# Patient Record
Sex: Female | Born: 1995 | Hispanic: Yes | Marital: Single | Smoking: Never smoker
Health system: Southern US, Community
[De-identification: ages and names within clinical notes are randomized; demographics above are authoritative.]

## PROBLEM LIST (undated history)

## (undated) ENCOUNTER — Inpatient Hospital Stay (HOSPITAL_COMMUNITY): Payer: Self-pay

## (undated) DIAGNOSIS — F329 Major depressive disorder, single episode, unspecified: Secondary | ICD-10-CM

## (undated) DIAGNOSIS — F32A Depression, unspecified: Secondary | ICD-10-CM

## (undated) DIAGNOSIS — R002 Palpitations: Secondary | ICD-10-CM

## (undated) HISTORY — PX: TONSILLECTOMY: SUR1361

## (undated) HISTORY — PX: ADENOIDECTOMY: SUR15

## (undated) HISTORY — DX: Palpitations: R00.2

---
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
## 2016-03-10 ENCOUNTER — Emergency Department (HOSPITAL_COMMUNITY)
Admission: EM | Admit: 2016-03-10 | Discharge: 2016-03-11 | Disposition: A | Discharge status: Home / Self Care | Payer: Self-pay | Attending: Emergency Medicine | Admitting: Emergency Medicine

## 2016-03-10 ENCOUNTER — Emergency Department (HOSPITAL_COMMUNITY): Payer: Self-pay

## 2016-03-10 ENCOUNTER — Encounter (HOSPITAL_COMMUNITY): Payer: Self-pay | Admitting: Emergency Medicine

## 2016-03-10 DIAGNOSIS — R519 Headache, unspecified: Secondary | ICD-10-CM

## 2016-03-10 DIAGNOSIS — R51 Headache: Secondary | ICD-10-CM | POA: Insufficient documentation

## 2016-03-10 LAB — I-STAT CHEM 8, ED
BUN: 18 mg/dL (ref 6–20)
CHLORIDE: 105 mmol/L (ref 101–111)
CREATININE: 0.7 mg/dL (ref 0.44–1.00)
Calcium, Ion: 1.18 mmol/L (ref 1.15–1.40)
Glucose, Bld: 104 mg/dL — ABNORMAL HIGH (ref 65–99)
HCT: 36 % (ref 36.0–46.0)
Hemoglobin: 12.2 g/dL (ref 12.0–15.0)
POTASSIUM: 3.7 mmol/L (ref 3.5–5.1)
Sodium: 139 mmol/L (ref 135–145)
TCO2: 23 mmol/L (ref 0–100)

## 2016-03-10 LAB — I-STAT BETA HCG BLOOD, ED (MC, WL, AP ONLY)

## 2016-03-10 MED ORDER — KETOROLAC TROMETHAMINE 30 MG/ML IJ SOLN
30.0000 mg | Freq: Once | INTRAMUSCULAR | Status: AC
  Administered 2016-03-11: 30 mg via INTRAVENOUS
  Filled 2016-03-10: qty 1

## 2016-03-10 MED ORDER — PROCHLORPERAZINE EDISYLATE 5 MG/ML IJ SOLN: 10.0000 mg | Freq: Four times a day (QID) | INTRAMUSCULAR | Status: DC | PRN

## 2016-03-10 MED ORDER — MORPHINE SULFATE (PF) 4 MG/ML IV SOLN
4.0000 mg | Freq: Once | INTRAVENOUS | Status: AC
  Administered 2016-03-11: 4 mg via INTRAVENOUS
  Filled 2016-03-10: qty 1

## 2016-03-10 NOTE — ED Notes (Signed)
Patient transported to CT 

## 2016-03-10 NOTE — ED Provider Notes (Signed)
[facility]-EMERGENCY DEPT Provider Note   CSN: [id number] Arrival date & time: 03/10/16  2114 By signing my name below, I, Linus GalasMaharshi Patel, attest that this documentation has been prepared under the direction and in the presence of Azalia BilisKevin Bibi Economos, MD. Electronically Signed: Linus GalasMaharshi Patel, ED Scribe. 03/10/16. 11:13 PM.  History   Chief Complaint Chief Complaint  Patient presents with  . Headache   The history is provided by the patient. No language interpreter was used.   HPI Comments: Caitlin Good is a 20 y.o. female who presents to the Emergency Department with a PMHx of chronic HA complaining of a constant, worsening HA that has been ongoing for the past 2 weeks. Pt also reports associated right arm weakness and dizziness. She states that when she woke up earlier today from sleep she had a severe HA and felt like she was going to faint. She took ibuprofen, Aleve and Advil with no relief. She has been seeing a Insurance account managereurologist in [place] who has been managing her chronic HA for the past couple of years. The Neurologist has prescribed and modified different medications but has been unable to manage the pt chronic HA. She last had an MRI a few years ago. Pt denies any visual fevers, chills, CP, SOB, visual disturbance or any other symptoms at this time. NKDA  LNMP last month.   Past Medical History:  Diagnosis Date  . Chronic headaches     There are no active problems to display for this patient.  Past Surgical History:  Procedure Laterality Date  . TONSILLECTOMY     OB History    No data available     Home Medications    Prior to Admission medications   Medication Sig Start Date End Date Taking? Authorizing Provider  ibuprofen (ADVIL,MOTRIN) 600 MG tablet Take 1 tablet (600 mg total) by mouth every 6 (six) hours as needed. 07/19/13   Renne CriglerJoshua Geiple, PA-C  nortriptyline (PAMELOR) 50 MG capsule Take 50 mg by mouth at bedtime.    Historical Provider, MD  SUMAtriptan (IMITREX) 100 MG tablet  Take 100 mg by mouth every 2 (two) hours as needed for migraine or headache. May repeat in 2 hours if headache persists or recurs.    Historical Provider, MD  traMADol-acetaminophen (ULTRACET) 37.5-325 MG per tablet Take 1 tablet by mouth every 6 (six) hours as needed for moderate pain.    Historical Provider, MD    Family History History reviewed. No pertinent family history.  Social History Social History  Substance Use Topics  . Smoking status: Never Smoker  . Smokeless tobacco: Never Used  . Alcohol use No   Allergies   Review of patient's allergies indicates no known allergies.  Review of Systems Review of Systems A complete 10 system review of systems was obtained and all systems are negative except as noted in the HPI and PMH.   Physical Exam Updated Vital Signs BP 133/80   Pulse 70   Temp 98.2 F (36.8 C) (Oral)   Resp 16   Ht 5\' 2"  (1.575 m)   LMP 02/10/2016   SpO2 100%   Physical Exam  Constitutional: She is oriented to person, place, and time. She appears well-developed and well-nourished. No distress.  HENT:  Head: Normocephalic and atraumatic.  Eyes: EOM are normal. Pupils are equal, round, and reactive to light.  Neck: Normal range of motion.  Cardiovascular: Normal rate, regular rhythm and normal heart sounds.   Pulmonary/Chest: Effort normal and breath sounds normal.  Abdominal: Soft.  She exhibits no distension. There is no tenderness.  Musculoskeletal: Normal range of motion.  Neurological: She is alert and oriented to person, place, and time.  5/5 strength in major muscle groups of  bilateral upper and lower extremities. Speech normal. No facial asymetry.   Skin: Skin is warm and dry.  Psychiatric: She has a normal mood and affect. Judgment normal.  Nursing note and vitals reviewed.  ED Treatments / Results  DIAGNOSTIC STUDIES: Oxygen Saturation is 100% on room air, normal by my interpretation.    COORDINATION OF CARE: 11:13 PM Discussed  treatment plan with pt at bedside and pt agreed to plan.  Labs (all labs ordered are listed, but only abnormal results are displayed) Labs Reviewed  I-STAT CHEM 8, ED - Abnormal; Notable for the following:       Result Value   Glucose, Bld 104 (*)    All other components within normal limits  I-STAT BETA HCG BLOOD, ED ([facility], [facility], [facility] ONLY)    EKG  EKG Interpretation None      Radiology Ct Head Wo Contrast  Result Date: 03/10/2016 CLINICAL DATA:  Subacute onset of mid to posterior headache and mildly blurred vision. Mild right-sided weakness. Initial encounter. EXAM: CT HEAD WITHOUT CONTRAST TECHNIQUE: Contiguous axial images were obtained from the base of the skull through the vertex without intravenous contrast. COMPARISON:  None. FINDINGS: Brain: No evidence of acute infarction, hemorrhage, hydrocephalus, extra-axial collection or mass lesion/mass effect. The posterior fossa, including the cerebellum, brainstem and fourth ventricle, is within normal limits. The third and lateral ventricles, and basal ganglia are unremarkable in appearance. The cerebral hemispheres are symmetric in appearance, with normal gray-white differentiation. No mass effect or midline shift is seen. Vascular: No hyperdense vessel or unexpected calcification. Skull: There is no evidence of fracture; visualized osseous structures are unremarkable in appearance. Sinuses/Orbits: The visualized portions of the orbits are within normal limits. The paranasal sinuses and mastoid air cells are well-aerated. Other: No significant soft tissue abnormalities are seen. IMPRESSION: Unremarkable noncontrast CT of the head. Electronically Signed   By: Roanna RaiderJeffery  Chang M.D.   On: 03/10/2016 23:48    Procedures Procedures (including critical care time)  Medications Ordered in ED Medications  prochlorperazine (COMPAZINE) injection 10 mg (not administered)  ketorolac (TORADOL) 30 MG/ML injection 30 mg (not administered)  morphine 4 MG/ML  injection 4 mg (not administered)     Initial Impression / Assessment and Plan / ED Course  I have reviewed the triage vital signs and the nursing notes.  Pertinent labs & imaging results that were available during my care of the patient were reviewed by me and considered in my medical decision making (see chart for details).  Clinical Course   1:36 AM Feels better at this time. Dc home with outpatient neurology. Migraine variant. Head CT normal.   Final Clinical Impressions(s) / ED Diagnoses   Final diagnoses:  Acute nonintractable headache, unspecified headache type  Headache disorder    New Prescriptions New Prescriptions   No medications on file   I personally performed the services described in this documentation, which was scribed in my presence. The recorded information has been reviewed and is accurate.        Azalia BilisKevin Severino Paolo, MD 03/11/16 [phone]

## 2016-03-10 NOTE — ED Triage Notes (Signed)
Pt presents to ED for assessment of chronic headaches.  Pt sts 5 years hx of headaches, plus having been seen by a neurologist.  Pt sts many different medicines tried without relief.  Pt c/o HA with dizziness and generalized weakness.  Pt also states "there's a spot on the back of my head that if someone touches it it gives me a headache".

## 2016-05-29 ENCOUNTER — Emergency Department (HOSPITAL_COMMUNITY)
Admission: EM | Admit: 2016-05-29 | Discharge: 2016-05-29 | Disposition: A | Discharge status: Home / Self Care | Payer: Medicaid - Out of State | Attending: Emergency Medicine | Admitting: Emergency Medicine

## 2016-05-29 ENCOUNTER — Encounter (HOSPITAL_COMMUNITY): Payer: Self-pay | Admitting: Emergency Medicine

## 2016-05-29 DIAGNOSIS — R69 Illness, unspecified: Secondary | ICD-10-CM

## 2016-05-29 DIAGNOSIS — J111 Influenza due to unidentified influenza virus with other respiratory manifestations: Secondary | ICD-10-CM | POA: Insufficient documentation

## 2016-05-29 LAB — RAPID STREP SCREEN (MED CTR MEBANE ONLY): STREPTOCOCCUS, GROUP A SCREEN (DIRECT): NEGATIVE

## 2016-05-29 MED ORDER — NAPROXEN 250 MG PO TABS: 250.0000 mg | ORAL_TABLET | Freq: Two times a day (BID) | ORAL | 0 refills | Status: DC

## 2016-05-29 MED ORDER — FLUTICASONE PROPIONATE 50 MCG/ACT NA SUSP: 2.0000 | Freq: Every day | NASAL | 0 refills | Status: DC

## 2016-05-29 MED ORDER — CETIRIZINE HCL 10 MG PO TABS: 10.0000 mg | ORAL_TABLET | Freq: Every day | ORAL | 1 refills | Status: DC

## 2016-05-29 MED ORDER — BENZONATATE 100 MG PO CAPS: 100.0000 mg | ORAL_CAPSULE | Freq: Three times a day (TID) | ORAL | 0 refills | Status: DC | PRN

## 2016-05-29 MED ORDER — IBUPROFEN 800 MG PO TABS
800.0000 mg | ORAL_TABLET | Freq: Once | ORAL | Status: AC
  Administered 2016-05-29: 800 mg via ORAL
  Filled 2016-05-29: qty 1

## 2016-05-29 NOTE — ED Triage Notes (Addendum)
Pt has sore throat-- white patches noted on left side of throat-- started on Thursday--had flu shot 1 month ago, has been taking dayquil and mucinex.  this am-- had a fever at home,

## 2016-05-29 NOTE — ED Provider Notes (Signed)
[facility]-EMERGENCY DEPT Provider Note   CSN: [id number] Arrival date & time: 05/29/16  [id number]    By signing my name below, I, Caitlin Good, attest that this documentation has been prepared under the direction and in the presence of  Everlene FarrierWilliam Arnaldo Heffron, [place]. Electronically Signed: Valentino SaxonBianca Good, ED Scribe. 05/29/16. 10:56 AM.  History   Chief Complaint Chief Complaint  Patient presents with  . Sore Throat   The history is provided by the patient. No language interpreter was used.   HPI Comments: Caitlin Frohlichdriana Good is a 21 y.o. female who presents to the Emergency Department complaining of moderate, constant, sore throat onset three days ago. Pt notes hx of strep throat and states symptoms are similar. She also notes having a tonsillectomy in the past. Pt reports associated subjective fevers, cough, postnasal drip, and nasal congestion, chills and body aches. Pt's temperature in the ED today was 99.3. She reports taking DayQuil and Mucinex at 7 am today with minimal relief. She states pain with swallowing. Pt is able to swallow liquids with pain. She denies recent antibiotic use. Per nurse note, pt received a flu shot a month ago. Denies rhinorrhea, sneezing, HA, CP, SOB, abdominal pain, neck pain, rash.   Past Medical History:  Diagnosis Date  . Chronic headaches     There are no active problems to display for this patient.   Past Surgical History:  Procedure Laterality Date  . TONSILLECTOMY      OB History    No data available       Home Medications    Prior to Admission medications   Medication Sig Start Date End Date Taking? Authorizing Provider  benzonatate (TESSALON) 100 MG capsule Take 1 capsule (100 mg total) by mouth 3 (three) times daily as needed for cough. 05/29/16   Everlene FarrierWilliam Taytem Ghattas, PA-C  cetirizine (ZYRTEC ALLERGY) 10 MG tablet Take 1 tablet (10 mg total) by mouth daily. 05/29/16   Everlene FarrierWilliam Nikole Swartzentruber, PA-C  fluticasone (FLONASE) 50 MCG/ACT nasal spray Place 2 sprays into  both nostrils daily. 05/29/16   Everlene FarrierWilliam Bessie Livingood, PA-C  ibuprofen (ADVIL,MOTRIN) 600 MG tablet Take 1 tablet (600 mg total) by mouth every 6 (six) hours as needed. 07/19/13   Renne CriglerJoshua Geiple, PA-C  naproxen (NAPROSYN) 250 MG tablet Take 1 tablet (250 mg total) by mouth 2 (two) times daily with a meal. 05/29/16   Everlene FarrierWilliam Nayla Dias, PA-C  nortriptyline (PAMELOR) 50 MG capsule Take 50 mg by mouth at bedtime.    Historical Provider, MD  SUMAtriptan (IMITREX) 100 MG tablet Take 100 mg by mouth every 2 (two) hours as needed for migraine or headache. May repeat in 2 hours if headache persists or recurs.    Historical Provider, MD  traMADol-acetaminophen (ULTRACET) 37.5-325 MG per tablet Take 1 tablet by mouth every 6 (six) hours as needed for moderate pain.    Historical Provider, MD    Family History No family history on file.  Social History Social History  Substance Use Topics  . Smoking status: Never Smoker  . Smokeless tobacco: Never Used  . Alcohol use No     Allergies   Patient has no known allergies.   Review of Systems Review of Systems  Constitutional: Positive for chills, fatigue and fever.  HENT: Positive for congestion, postnasal drip, rhinorrhea, sneezing and sore throat. Negative for trouble swallowing.   Eyes: Negative for visual disturbance.  Respiratory: Positive for cough. Negative for shortness of breath.   Cardiovascular: Negative for chest pain.  Gastrointestinal: Negative for abdominal  pain.  Musculoskeletal: Positive for myalgias. Negative for neck pain and neck stiffness.  Skin: Negative for rash and wound.  Neurological: Negative for headaches.     Physical Exam Updated Vital Signs BP 128/72 (BP Location: Left Arm)   Pulse 88   Temp 99.3 F (37.4 C)   Resp 19   Ht 5\' 3"  (1.6 m)   Wt 85.3 kg   LMP 05/22/2016 (Exact Date)   SpO2 99%   BMI 33.30 kg/m   Physical Exam  Constitutional: She appears well-developed and well-nourished. No distress.  Nontoxic  appearing.  HENT:  Head: Normocephalic and atraumatic.  Right Ear: External ear normal.  Left Ear: External ear normal.  Mouth/Throat: Oropharynx is clear and moist. No oropharyngeal exudate.  TM's are normal. No erythema or loss of landmarks. Boggy nasal turbinates bilaterally. Tonsils are surgically absent. Mild posterior oropharyngeal erythema without edema. No trismus. No drooling. Uvula is midline without edema. Tongue protrusion is normal    Eyes: Conjunctivae are normal. Pupils are equal, round, and reactive to light. Right eye exhibits no discharge. Left eye exhibits no discharge.  Neck: Neck supple.  Cardiovascular: Normal rate, regular rhythm, normal heart sounds and intact distal pulses.   Pulmonary/Chest: Effort normal and breath sounds normal. No respiratory distress. She has no wheezes. She has no rales.  Lungs are clear auscultation bilaterally.  Abdominal: Soft. There is no tenderness.  Lymphadenopathy:    She has no cervical adenopathy.  Neurological: She is alert. Coordination normal.  Skin: Skin is warm and dry. Capillary refill takes less than 2 seconds. No rash noted. She is not diaphoretic. No erythema. No pallor.  Psychiatric: She has a normal mood and affect. Her behavior is normal.  Nursing note and vitals reviewed.    ED Treatments / Results   DIAGNOSTIC STUDIES: Oxygen Saturation is 99% on RA, normal by my interpretation.    COORDINATION OF CARE: 10:51 AM Discussed treatment plan with pt at bedside which includes rapid strep test and pt agreed to plan.    Labs (all labs ordered are listed, but only abnormal results are displayed) Labs Reviewed  RAPID STREP SCREEN (NOT AT [facility])  CULTURE, GROUP A STREP [facility])    EKG  EKG Interpretation None       Radiology No results found.  Procedures Procedures (including critical care time)  Medications Ordered in ED Medications  ibuprofen (ADVIL,MOTRIN) tablet 800 mg (800 mg Oral Given 05/29/16 1151)       Initial Impression / Assessment and Plan / ED Course  I have reviewed the triage vital signs and the nursing notes.  Pertinent labs & imaging results that were available during my care of the patient were reviewed by me and considered in my medical decision making (see chart for details).    This is a 21 y.o. female who presents to the Emergency Department complaining of moderate, constant, sore throat onset three days ago. Pt notes hx of strep throat and states symptoms are similar. She also notes having a tonsillectomy in the past. Pt reports associated subjective fevers, cough, postnasal drip, and nasal congestion, chills and body aches.  On exam the patient is afebrile nontoxic appearing. Tonsils are surgically absent. No exudates. Uvula is midline without edema. No peritonsillar abscess. Lungs are clear to auscultation bilaterally. Rhinorrhea present.  Rapid strep is negative. I suspect flulike illness. Patient is able to tolerate water and ibuprofen in the emergency department. We'll discharge with prescriptions for Tessalon Perles, Zyrtec, Flonase and naproxen.  I encouraged her to push oral fluids. She understands that symptom onset is greater than the 48 hour window for Tamiflu. I discussed return precautions. I advised the patient to follow-up with their primary care provider this week. I advised the patient to return to the emergency department with new or worsening symptoms or new concerns. The patient verbalized understanding and agreement with plan.      Final Clinical Impressions(s) / ED Diagnoses   Final diagnoses:  Influenza-like illness    New Prescriptions Discharge Medication List as of 05/29/2016 11:39 AM    START taking these medications   Details  benzonatate (TESSALON) 100 MG capsule Take 1 capsule (100 mg total) by mouth 3 (three) times daily as needed for cough., Starting Sun 05/29/2016, Print    cetirizine (ZYRTEC ALLERGY) 10 MG tablet Take 1 tablet (10 mg  total) by mouth daily., Starting Sun 05/29/2016, Print    fluticasone (FLONASE) 50 MCG/ACT nasal spray Place 2 sprays into both nostrils daily., Starting Sun 05/29/2016, Print    naproxen (NAPROSYN) 250 MG tablet Take 1 tablet (250 mg total) by mouth 2 (two) times daily with a meal., Starting Sun 05/29/2016, Print        I personally performed the services described in this documentation, which was scribed in my presence. The recorded information has been reviewed and is accurate.       Everlene Farrier, PA-C 05/30/16 1610    Geoffery Lyons, MD 05/30/16 [phone]

## 2016-05-31 LAB — CULTURE, GROUP A STREP (THRC)

## 2016-09-21 ENCOUNTER — Inpatient Hospital Stay (HOSPITAL_COMMUNITY)
Admission: AD | Admit: 2016-09-21 | Discharge: 2016-09-21 | Disposition: A | Discharge status: Home / Self Care | Payer: Self-pay | Source: Ambulatory Visit | Attending: Obstetrics and Gynecology | Admitting: Obstetrics and Gynecology

## 2016-09-21 ENCOUNTER — Encounter (HOSPITAL_COMMUNITY): Payer: Self-pay | Admitting: *Deleted

## 2016-09-21 ENCOUNTER — Inpatient Hospital Stay (HOSPITAL_COMMUNITY): Payer: Self-pay

## 2016-09-21 DIAGNOSIS — Z79899 Other long term (current) drug therapy: Secondary | ICD-10-CM | POA: Insufficient documentation

## 2016-09-21 DIAGNOSIS — O3680X0 Pregnancy with inconclusive fetal viability, not applicable or unspecified: Secondary | ICD-10-CM

## 2016-09-21 DIAGNOSIS — O209 Hemorrhage in early pregnancy, unspecified: Secondary | ICD-10-CM | POA: Insufficient documentation

## 2016-09-21 DIAGNOSIS — Z79891 Long term (current) use of opiate analgesic: Secondary | ICD-10-CM | POA: Insufficient documentation

## 2016-09-21 DIAGNOSIS — Z3492 Encounter for supervision of normal pregnancy, unspecified, second trimester: Secondary | ICD-10-CM

## 2016-09-21 HISTORY — DX: Major depressive disorder, single episode, unspecified: F32.9

## 2016-09-21 HISTORY — DX: Depression, unspecified: F32.A

## 2016-09-21 LAB — URINALYSIS, MICROSCOPIC (REFLEX)

## 2016-09-21 LAB — URINALYSIS, ROUTINE W REFLEX MICROSCOPIC

## 2016-09-21 LAB — CBC
HEMATOCRIT: 37.6 % (ref 36.0–46.0)
Hemoglobin: 12.7 g/dL (ref 12.0–15.0)
MCH: 27.9 pg (ref 26.0–34.0)
MCHC: 33.8 g/dL (ref 30.0–36.0)
MCV: 82.6 fL (ref 78.0–100.0)
PLATELETS: 321 10*3/uL (ref 150–400)
RBC: 4.55 MIL/uL (ref 3.87–5.11)
RDW: 14 % (ref 11.5–15.5)
WBC: 10.6 10*3/uL — AB (ref 4.0–10.5)

## 2016-09-21 LAB — WET PREP, GENITAL
CLUE CELLS WET PREP: NONE SEEN
Sperm: NONE SEEN
Trich, Wet Prep: NONE SEEN
Yeast Wet Prep HPF POC: NONE SEEN

## 2016-09-21 LAB — POCT PREGNANCY, URINE: Preg Test, Ur: POSITIVE — AB

## 2016-09-21 LAB — HCG, QUANTITATIVE, PREGNANCY: HCG, BETA CHAIN, QUANT, S: 124 m[IU]/mL — AB (ref <5)

## 2016-09-21 LAB — ABO/RH: ABO/RH(D): A POS

## 2016-09-21 NOTE — MAU Note (Addendum)
Started having abd cramps last night and saw blood and clots last night. More bleeding and some clots this am. Some abd cramping in lower abd. LMP 08/02/16. Blood is dark red to brown

## 2016-09-21 NOTE — MAU Provider Note (Signed)
Chief Complaint: No chief complaint on file.   First Provider Initiated Contact with Patient 09/21/16 1129        SUBJECTIVE  HPI: Caitlin Good is a 21 y.o. G1P0 at Unknown by LMP who presents to maternity admissions reporting heavy vaginal bleeding, blood clots and cramping that started last night.  She reports dark red to brown bleeding.  She reports 2 (+) HPT on 09/16/16. Last IC >3 wks ago. She rates that pain a 6/10.  She denies vaginal itching/burning, urinary symptoms, h/a, dizziness, n/v, or fever/chills.    Past Medical History:  Diagnosis Date  . Depression    No recent meds (used Lexapro)  . Medical history non-contributory   . Pregnancy of unknown anatomic location 09/21/2016   Past Surgical History:  Procedure Laterality Date  . ADENOIDECTOMY     Social History   Social History  . Marital status: Single    Spouse name: N/A  . Number of children: N/A  . Years of education: N/A   Occupational History  . Not on file.   Social History Main Topics  . Smoking status: Never Smoker  . Smokeless tobacco: Never Used  . Alcohol use No  . Drug use: No  . Sexual activity: Yes    Birth control/ protection: None   Other Topics Concern  . Not on file   Social History Narrative  . No narrative on file   No current facility-administered medications on file prior to encounter.    Current Outpatient Prescriptions on File Prior to Encounter  Medication Sig Dispense Refill  . benzonatate (TESSALON) 100 MG capsule Take 1 capsule (100 mg total) by mouth 3 (three) times daily as needed for cough. 21 capsule 0  . cetirizine (ZYRTEC ALLERGY) 10 MG tablet Take 1 tablet (10 mg total) by mouth daily. 30 tablet 1  . fluticasone (FLONASE) 50 MCG/ACT nasal spray Place 2 sprays into both nostrils daily. 16 g 0  . ibuprofen (ADVIL,MOTRIN) 600 MG tablet Take 1 tablet (600 mg total) by mouth every 6 (six) hours as needed. 20 tablet 0  . naproxen (NAPROSYN) 250 MG tablet Take 1 tablet  (250 mg total) by mouth 2 (two) times daily with a meal. 30 tablet 0  . nortriptyline (PAMELOR) 50 MG capsule Take 50 mg by mouth at bedtime.    . SUMAtriptan (IMITREX) 100 MG tablet Take 100 mg by mouth every 2 (two) hours as needed for migraine or headache. May repeat in 2 hours if headache persists or recurs.    . traMADol-acetaminophen (ULTRACET) 37.5-325 MG per tablet Take 1 tablet by mouth every 6 (six) hours as needed for moderate pain.     No Known Allergies  I have reviewed patient's Past Medical Hx, Surgical Hx, Family Hx, Social Hx, medications and allergies.   ROS:  Review of Systems  Constitutional: Negative.   HENT: Negative.   Eyes: Negative.   Respiratory: Negative.   Cardiovascular: Negative.   Gastrointestinal: Negative.   Endocrine: Negative.   Genitourinary: Positive for pelvic pain (UC's x 10 days) and vaginal bleeding (heavy with clots).  Musculoskeletal: Positive for back pain (lower x 10 days).  Skin: Negative.   Allergic/Immunologic: Negative.   Neurological: Negative.   Hematological: Negative.   Psychiatric/Behavioral: Negative.      Physical Exam  Physical Exam Patient Vitals for the past 24 hrs:  BP Temp Pulse Resp Height  09/21/16 1121 133/71 98.6 F (37 C) 74 18 5\' 3"  (1.6 m)   Constitutional:  Well-developed, well-nourished female in no acute distress.  Cardiovascular: normal rate Respiratory: normal effort GI: Abd soft, non-tender. Pos BS x 4 MS: Extremities nontender, no edema, normal ROM Neurologic: Alert and oriented x 4.  GU: Neg CVAT. PELVIC EXAM: Cervix pink, visually closed, without lesion, scant white creamy discharge, vaginal walls and external genitalia normal Bimanual exam: Cervix 0/long/high, firm, anterior, neg CMT, uterus nontender, nonenlarged, adnexa without tenderness, enlargement, or mass   LAB RESULTS Results for orders placed or performed during the hospital encounter of 09/21/16 (from the past 24 hour(s))   Urinalysis, Routine w reflex microscopic     Status: Abnormal   Collection Time: 09/21/16 11:10 AM  Result Value Ref Range   Color, Urine RED (A) YELLOW   APPearance TURBID (A) CLEAR   Specific Gravity, Urine  1.005 - 1.030    TEST NOT REPORTED DUE TO COLOR INTERFERENCE OF URINE PIGMENT   pH  5.0 - 8.0    TEST NOT REPORTED DUE TO COLOR INTERFERENCE OF URINE PIGMENT   Glucose, UA (A) NEGATIVE mg/dL    TEST NOT REPORTED DUE TO COLOR INTERFERENCE OF URINE PIGMENT   Hgb urine dipstick (A) NEGATIVE    TEST NOT REPORTED DUE TO COLOR INTERFERENCE OF URINE PIGMENT   Bilirubin Urine (A) NEGATIVE    TEST NOT REPORTED DUE TO COLOR INTERFERENCE OF URINE PIGMENT   Ketones, ur (A) NEGATIVE mg/dL    TEST NOT REPORTED DUE TO COLOR INTERFERENCE OF URINE PIGMENT   Protein, ur (A) NEGATIVE mg/dL    TEST NOT REPORTED DUE TO COLOR INTERFERENCE OF URINE PIGMENT   Nitrite (A) NEGATIVE    TEST NOT REPORTED DUE TO COLOR INTERFERENCE OF URINE PIGMENT   Leukocytes, UA (A) NEGATIVE    TEST NOT REPORTED DUE TO COLOR INTERFERENCE OF URINE PIGMENT  Urinalysis, Microscopic (reflex)     Status: Abnormal   Collection Time: 09/21/16 11:10 AM  Result Value Ref Range   RBC / HPF TOO NUMEROUS TO COUNT 0 - 5 RBC/hpf   WBC, UA 0-5 0 - 5 WBC/hpf   Bacteria, UA FEW (A) NONE SEEN   Squamous Epithelial / LPF 0-5 (A) NONE SEEN   Urine-Other MUCOUS PRESENT   Pregnancy, urine POC     Status: Abnormal   Collection Time: 09/21/16 11:40 AM  Result Value Ref Range   Preg Test, Ur POSITIVE (A) NEGATIVE  Wet prep, genital     Status: Abnormal   Collection Time: 09/21/16 11:55 AM  Result Value Ref Range   Yeast Wet Prep HPF POC NONE SEEN NONE SEEN   Trich, Wet Prep NONE SEEN NONE SEEN   Clue Cells Wet Prep HPF POC NONE SEEN NONE SEEN   WBC, Wet Prep HPF POC FEW (A) NONE SEEN   Sperm NONE SEEN   CBC     Status: Abnormal   Collection Time: 09/21/16 12:23 PM  Result Value Ref Range   WBC 10.6 (H) 4.0 - 10.5 K/uL   RBC 4.55  3.87 - 5.11 MIL/uL   Hemoglobin 12.7 12.0 - 15.0 g/dL   HCT 91.4 78.2 - 95.6 %   MCV 82.6 78.0 - 100.0 fL   MCH 27.9 26.0 - 34.0 pg   MCHC 33.8 30.0 - 36.0 g/dL   RDW 21.3 08.6 - 57.8 %   Platelets 321 150 - 400 K/uL  ABO/Rh     Status: None   Collection Time: 09/21/16 12:23 PM  Result Value Ref Range   ABO/RH(D) A POS  hCG, quantitative, pregnancy     Status: Abnormal   Collection Time: 09/21/16 12:23 PM  Result Value Ref Range   hCG, Beta Chain, Quant, S 124 (H) <5 mIU/mL   Results for orders placed or performed during the hospital encounter of 09/21/16 (from the past 24 hour(s))  Urinalysis, Routine w reflex microscopic     Status: Abnormal   Collection Time: 09/21/16 11:10 AM  Result Value Ref Range   Color, Urine RED (A) YELLOW   APPearance TURBID (A) CLEAR   Specific Gravity, Urine  1.005 - 1.030    TEST NOT REPORTED DUE TO COLOR INTERFERENCE OF URINE PIGMENT   pH  5.0 - 8.0    TEST NOT REPORTED DUE TO COLOR INTERFERENCE OF URINE PIGMENT   Glucose, UA (A) NEGATIVE mg/dL    TEST NOT REPORTED DUE TO COLOR INTERFERENCE OF URINE PIGMENT   Hgb urine dipstick (A) NEGATIVE    TEST NOT REPORTED DUE TO COLOR INTERFERENCE OF URINE PIGMENT   Bilirubin Urine (A) NEGATIVE    TEST NOT REPORTED DUE TO COLOR INTERFERENCE OF URINE PIGMENT   Ketones, ur (A) NEGATIVE mg/dL    TEST NOT REPORTED DUE TO COLOR INTERFERENCE OF URINE PIGMENT   Protein, ur (A) NEGATIVE mg/dL    TEST NOT REPORTED DUE TO COLOR INTERFERENCE OF URINE PIGMENT   Nitrite (A) NEGATIVE    TEST NOT REPORTED DUE TO COLOR INTERFERENCE OF URINE PIGMENT   Leukocytes, UA (A) NEGATIVE    TEST NOT REPORTED DUE TO COLOR INTERFERENCE OF URINE PIGMENT  Urinalysis, Microscopic (reflex)     Status: Abnormal   Collection Time: 09/21/16 11:10 AM  Result Value Ref Range   RBC / HPF TOO NUMEROUS TO COUNT 0 - 5 RBC/hpf   WBC, UA 0-5 0 - 5 WBC/hpf   Bacteria, UA FEW (A) NONE SEEN   Squamous Epithelial / LPF 0-5 (A) NONE SEEN    Urine-Other MUCOUS PRESENT   Pregnancy, urine POC     Status: Abnormal   Collection Time: 09/21/16 11:40 AM  Result Value Ref Range   Preg Test, Ur POSITIVE (A) NEGATIVE  Wet prep, genital     Status: Abnormal   Collection Time: 09/21/16 11:55 AM  Result Value Ref Range   Yeast Wet Prep HPF POC NONE SEEN NONE SEEN   Trich, Wet Prep NONE SEEN NONE SEEN   Clue Cells Wet Prep HPF POC NONE SEEN NONE SEEN   WBC, Wet Prep HPF POC FEW (A) NONE SEEN   Sperm NONE SEEN   CBC     Status: Abnormal   Collection Time: 09/21/16 12:23 PM  Result Value Ref Range   WBC 10.6 (H) 4.0 - 10.5 K/uL   RBC 4.55 3.87 - 5.11 MIL/uL   Hemoglobin 12.7 12.0 - 15.0 g/dL   HCT 11.9 14.7 - 82.9 %   MCV 82.6 78.0 - 100.0 fL   MCH 27.9 26.0 - 34.0 pg   MCHC 33.8 30.0 - 36.0 g/dL   RDW 56.2 13.0 - 86.5 %   Platelets 321 150 - 400 K/uL  ABO/Rh     Status: None   Collection Time: 09/21/16 12:23 PM  Result Value Ref Range   ABO/RH(D) A POS   hCG, quantitative, pregnancy     Status: Abnormal   Collection Time: 09/21/16 12:23 PM  Result Value Ref Range   hCG, Beta Chain, Quant, S 124 (H) <5 mIU/mL   US Ob Comp Less 14 Wks  Result Date: 09/21/2016  CLINICAL DATA:  Vaginal bleeding and first-trimester pregnancy. Quantitative beta HCG 124. EXAM: OBSTETRIC <14 WK US AND TRANSVAGINAL OB US TECHNIQUE: Both transabdominal and transvaginal ultrasound examinations were performed for complete evaluation of the gestation as well as the maternal uterus, adnexal regions, and pelvic cul-de-sac. Transvaginal technique was performed to assess early pregnancy. COMPARISON:  None. FINDINGS: No intra or extrauterine gestational sac is noted. Symmetric normal ovarian appearance. No adnexal mass. Small volume pelvic fluid, simple appearing. IMPRESSION: Pregnancy of unknown location. Small volume simple pelvic fluid; otherwise unremarkable pelvic ultrasound. Differential considerations include intrauterine gestation too early to be  sonographically visualized, spontaneous abortion, or ectopic pregnancy. Consider follow-up ultrasound in 10 days and serial quantitative beta HCG follow-up. Electronically Signed   By: Marnee SpringJonathon  Watts M.D.   On: 09/21/2016 13:17   [place] Ob Transvaginal  Result Date: 09/21/2016 CLINICAL DATA:  Vaginal bleeding and first-trimester pregnancy. Quantitative beta HCG 124. EXAM: OBSTETRIC <14 WK US AND TRANSVAGINAL OB US TECHNIQUE: Both transabdominal and transvaginal ultrasound examinations were performed for complete evaluation of the gestation as well as the maternal uterus, adnexal regions, and pelvic cul-de-sac. Transvaginal technique was performed to assess early pregnancy. COMPARISON:  None. FINDINGS: No intra or extrauterine gestational sac is noted. Symmetric normal ovarian appearance. No adnexal mass. Small volume pelvic fluid, simple appearing. IMPRESSION: Pregnancy of unknown location. Small volume simple pelvic fluid; otherwise unremarkable pelvic ultrasound. Differential considerations include intrauterine gestation too early to be sonographically visualized, spontaneous abortion, or ectopic pregnancy. Consider follow-up ultrasound in 10 days and serial quantitative beta HCG follow-up. Electronically Signed   By: Marnee SpringJonathon  Watts M.D.   On: 09/21/2016 13:17    MAU Management/MDM: Ordered usual first trimester r/o ectopic labs.   Pelvic exam and cultures done Will check baseline Ultrasound to rule out ectopic.  Cultures were done to rule out pelvic infection Blood drawn for Quant HCG, CBC, ABO/Rh  Return to [facility] for stat BHCG on 09/23/16 at 0800   ASSESSMENT 1. Pregnancy of unknown anatomic location   2. Vaginal bleeding in pregnancy, first trimester     PLAN Discharge home Plan to repeat HCG level in 48 hours in clinic per 8:00 am schedule on 09/23/16 Will repeat  Ultrasound in about 7-10 days if HCG levels double appropriately  Ectopic precautions  Follow-up Information    [facility] on 09/23/2016.   Specialty:  Obstetrics and Gynecology Why:  ARRIVE AT 8:00 AM Contact information: [address] [place] [id number] [phone]         Pt stable at time of discharge. Encouraged to return here or to other Urgent Care/ED if she develops worsening of symptoms, increase in pain, fever, or other concerning symptoms.    Raelyn Moraolitta Nike Southwell MSN, CNM 09/21/2016 , 1:42 PM

## 2016-09-21 NOTE — Progress Notes (Signed)
Raelyn Moraolitta Dawson CNM in to discuss test results and d/c plan. Written and verbal d/c instructions given and understanding voiced. To return Friday AM at 0800 [facility] for repeat BHCG or sooner for concerns.

## 2016-09-22 ENCOUNTER — Encounter (HOSPITAL_COMMUNITY): Payer: Self-pay

## 2016-09-22 ENCOUNTER — Inpatient Hospital Stay (HOSPITAL_COMMUNITY)
Admission: AD | Admit: 2016-09-22 | Discharge: 2016-09-22 | Disposition: A | Discharge status: Home / Self Care | Payer: Self-pay | Source: Ambulatory Visit | Attending: Obstetrics & Gynecology | Admitting: Obstetrics & Gynecology

## 2016-09-22 DIAGNOSIS — O200 Threatened abortion: Secondary | ICD-10-CM

## 2016-09-22 DIAGNOSIS — O209 Hemorrhage in early pregnancy, unspecified: Secondary | ICD-10-CM

## 2016-09-22 DIAGNOSIS — Z9889 Other specified postprocedural states: Secondary | ICD-10-CM | POA: Insufficient documentation

## 2016-09-22 DIAGNOSIS — Z3A01 Less than 8 weeks gestation of pregnancy: Secondary | ICD-10-CM | POA: Insufficient documentation

## 2016-09-22 DIAGNOSIS — O3680X0 Pregnancy with inconclusive fetal viability, not applicable or unspecified: Secondary | ICD-10-CM

## 2016-09-22 LAB — HIV ANTIBODY (ROUTINE TESTING W REFLEX): HIV Screen 4th Generation wRfx: NONREACTIVE

## 2016-09-22 LAB — CBC
HEMATOCRIT: 38.3 % (ref 36.0–46.0)
Hemoglobin: 13.1 g/dL (ref 12.0–15.0)
MCH: 28.2 pg (ref 26.0–34.0)
MCHC: 34.2 g/dL (ref 30.0–36.0)
MCV: 82.5 fL (ref 78.0–100.0)
PLATELETS: 355 10*3/uL (ref 150–400)
RBC: 4.64 MIL/uL (ref 3.87–5.11)
RDW: 13.9 % (ref 11.5–15.5)
WBC: 10.3 10*3/uL (ref 4.0–10.5)

## 2016-09-22 LAB — URINALYSIS, ROUTINE W REFLEX MICROSCOPIC
Bacteria, UA: NONE SEEN
Bilirubin Urine: NEGATIVE
Glucose, UA: NEGATIVE mg/dL
Ketones, ur: NEGATIVE mg/dL
Nitrite: NEGATIVE
PH: 6 (ref 5.0–8.0)
Protein, ur: NEGATIVE mg/dL
SPECIFIC GRAVITY, URINE: 1.024 (ref 1.005–1.030)

## 2016-09-22 LAB — GC/CHLAMYDIA PROBE AMP (~~LOC~~) NOT AT ARMC
CHLAMYDIA, DNA PROBE: NEGATIVE
NEISSERIA GONORRHEA: NEGATIVE

## 2016-09-22 LAB — HCG, QUANTITATIVE, PREGNANCY: hCG, Beta Chain, Quant, S: 76 m[IU]/mL — ABNORMAL HIGH (ref <5)

## 2016-09-22 NOTE — MAU Provider Note (Signed)
History     CSN: [id number]  Arrival date and time: 09/22/16 4098  First Provider Initiated Contact with Patient 09/22/16 1951      Chief Complaint  Patient presents with  . Vaginal Bleeding   HPI Dave Mannes is a 21 y.o. G2P0 at [redacted]w[redacted]d by LMP who presents for vaginal bleeding. Patient was seen in MAU yesterday for same complaint. Had BHCG of 124 & ultrasound that showed no IUP or adnexal mass. Patient reports vaginal bleeding has increased since yesterday & she continues to have abdominal cramping. Has gone through 3 pads today. Not saturated pads but has passed several small blood clots. Rates abdominal pain 7/10. Has not treated.   OB History    Gravida Para Term Preterm AB Living   1             SAB TAB Ectopic Multiple Live Births           0      Past Medical History:  Diagnosis Date  . Depression    No recent meds (used Lexapro)    Past Surgical History:  Procedure Laterality Date  . ADENOIDECTOMY    . TONSILLECTOMY      No family history on file.  Social History  Substance Use Topics  . Smoking status: Never Smoker  . Smokeless tobacco: Never Used  . Alcohol use No    Allergies: No Known Allergies  Prescriptions Prior to Admission  Medication Sig Dispense Refill Last Dose  . Prenatal Vit-Fe Fumarate-FA (PRENATAL MULTIVITAMIN) TABS tablet Take 1 tablet by mouth daily at 12 noon.   09/21/2016 at Unknown time    Review of Systems  Constitutional: Negative.  Negative for chills and fever.  Gastrointestinal: Positive for abdominal pain. Negative for constipation, diarrhea, nausea and vomiting.  Genitourinary: Positive for vaginal bleeding.   Physical Exam   Blood pressure (!) 134/58, pulse 73, temperature 98.7 F (37.1 C), temperature source Oral, resp. rate 18, last menstrual period 08/02/2016.  Physical Exam  Nursing note and vitals reviewed. Constitutional: She is oriented to person, place, and time. She appears well-developed and well-nourished.  No distress.  HENT:  Head: Normocephalic and atraumatic.  Eyes: Conjunctivae are normal. Right eye exhibits no discharge. Left eye exhibits no discharge. No scleral icterus.  Neck: Normal range of motion.  Respiratory: Effort normal. No respiratory distress.  GI: Soft. There is no tenderness.  Genitourinary: There is bleeding (small amount of dark red blood; dark blood slowly oozing from os; cervix closed) in the vagina.  Neurological: She is alert and oriented to person, place, and time.  Skin: Skin is warm and dry. She is not diaphoretic.  Psychiatric: She has a normal mood and affect. Her behavior is normal. Judgment and thought content normal.    MAU Course  Procedures Results for orders placed or performed during the hospital encounter of 09/22/16 (from the past 24 hour(s))  Urinalysis, Routine w reflex microscopic     Status: Abnormal   Collection Time: 09/22/16  6:40 PM  Result Value Ref Range   Color, Urine YELLOW YELLOW   APPearance CLEAR CLEAR   Specific Gravity, Urine 1.024 1.005 - 1.030   pH 6.0 5.0 - 8.0   Glucose, UA NEGATIVE NEGATIVE mg/dL   Hgb urine dipstick LARGE (A) NEGATIVE   Bilirubin Urine NEGATIVE NEGATIVE   Ketones, ur NEGATIVE NEGATIVE mg/dL   Protein, ur NEGATIVE NEGATIVE mg/dL   Nitrite NEGATIVE NEGATIVE   Leukocytes, UA SMALL (A) NEGATIVE   RBC /  HPF TOO NUMEROUS TO COUNT 0 - 5 RBC/hpf   WBC, UA TOO NUMEROUS TO COUNT 0 - 5 WBC/hpf   Bacteria, UA NONE SEEN NONE SEEN   Squamous Epithelial / LPF 0-5 (A) NONE SEEN   Mucous PRESENT   hCG, quantitative, pregnancy     Status: Abnormal   Collection Time: 09/22/16  7:14 PM  Result Value Ref Range   hCG, Beta Chain, Quant, S 76 (H) <5 mIU/mL  CBC     Status: None   Collection Time: 09/22/16  7:14 PM  Result Value Ref Range   WBC 10.3 4.0 - 10.5 K/uL   RBC 4.64 3.87 - 5.11 MIL/uL   Hemoglobin 13.1 12.0 - 15.0 g/dL   HCT 16.138.3 09.636.0 - 04.546.0 %   MCV 82.5 78.0 - 100.0 fL   MCH 28.2 26.0 - 34.0 pg   MCHC 34.2  30.0 - 36.0 g/dL   RDW 40.913.9 81.111.5 - 91.415.5 %   Platelets 355 150 - 400 K/uL    MDM A positive CBC, HCG hemoglobin stable BHCG dropped 124>76 Likely SAB but can't completely r/o ectopic since IUP never diagnosed & BHCG has not dropped more than 50% -- will get f/u bhcg in 48 hours  Assessment and Plan  A; 1. Threatened miscarriage   2. Vaginal bleeding in pregnancy, first trimester   3. Pregnancy of unknown anatomic location    P: Discharge home Pelvic rest Return to MAU Saturday evening for repeat BHCG Discussed reasons to return to MAU  Judeth HornErin Allisa Einspahr 09/22/2016, 7:51 PM

## 2016-09-22 NOTE — Discharge Instructions (Signed)
Return to care  °· If you have heavier bleeding that soaks through more that 2 pads per hour for an hour or more °· If you bleed so much that you feel like you might pass out or you do pass out °· If you have significant abdominal pain that is not improved with Tylenol  °· If you develop a fever > 100.5 ° ° ° ° ° °Pelvic Rest °Pelvic rest may be recommended if: °· Your placenta is partially or completely covering the opening of your cervix (placenta previa). °· There is bleeding between the wall of the uterus and the amniotic sac in the first trimester of pregnancy (subchorionic hemorrhage). °· You went into labor too early (preterm labor). °Based on your overall health and the health of your baby, your health care provider will decide if pelvic rest is right for you. °How do I rest my pelvis? °For as long as told by your health care provider: °· Do not have sex, sexual stimulation, or an orgasm. °· Do not use tampons. Do not douche. Do not put anything in your vagina. °· Do not lift anything that is heavier than 10 lb (4.5 kg). °· Avoid activities that take a lot of effort (are strenuous). °· Avoid any activity in which your pelvic muscles could become strained. °When should I seek medical care? °Seek medical care if you have: °· Cramping pain in your lower abdomen. °· Vaginal discharge. °· A low, dull backache. °· Regular contractions. °· Uterine tightening. °When should I seek immediate medical care? °Seek immediate medical care if: °· You have vaginal bleeding and you are pregnant. °This information is not intended to replace advice given to you by your health care provider. Make sure you discuss any questions you have with your health care provider. °Document Released: 08/20/2010 Document Revised: 10/01/2015 Document Reviewed: 10/27/2014 °Elsevier Interactive Patient Education © 2017 [employer] ° °

## 2016-09-22 NOTE — MAU Note (Signed)
Pt reports heavier vaginal bleeding and worsening cramping. Was here yesterday and told to follow up if pain and bleeding got worse.

## 2016-09-23 ENCOUNTER — Encounter (HOSPITAL_COMMUNITY): Payer: Self-pay | Admitting: Student

## 2016-09-23 ENCOUNTER — Encounter: Payer: Self-pay | Admitting: *Deleted

## 2016-09-23 ENCOUNTER — Ambulatory Visit: Payer: Self-pay

## 2016-09-23 NOTE — Progress Notes (Signed)
Pt seen in MAU last night does not need stat hcg level today. She was on the MAU Stat Sheet for today.

## 2016-09-24 ENCOUNTER — Inpatient Hospital Stay (HOSPITAL_COMMUNITY)
Admission: AD | Admit: 2016-09-24 | Discharge: 2016-09-25 | Disposition: A | Discharge status: Home / Self Care | Payer: Self-pay | Source: Ambulatory Visit | Attending: Obstetrics and Gynecology | Admitting: Obstetrics and Gynecology

## 2016-09-24 DIAGNOSIS — O4691 Antepartum hemorrhage, unspecified, first trimester: Secondary | ICD-10-CM | POA: Insufficient documentation

## 2016-09-24 DIAGNOSIS — Z3A01 Less than 8 weeks gestation of pregnancy: Secondary | ICD-10-CM | POA: Insufficient documentation

## 2016-09-24 DIAGNOSIS — O26891 Other specified pregnancy related conditions, first trimester: Secondary | ICD-10-CM | POA: Insufficient documentation

## 2016-09-24 DIAGNOSIS — R109 Unspecified abdominal pain: Secondary | ICD-10-CM | POA: Insufficient documentation

## 2016-09-24 DIAGNOSIS — O039 Complete or unspecified spontaneous abortion without complication: Secondary | ICD-10-CM

## 2016-09-24 NOTE — MAU Note (Signed)
Here for repeat blood work. Bad pains in lower abd today. Still bleeding some with some small blood clots. Used 4 pads today

## 2016-09-25 DIAGNOSIS — O039 Complete or unspecified spontaneous abortion without complication: Secondary | ICD-10-CM

## 2016-09-25 LAB — HCG, QUANTITATIVE, PREGNANCY: hCG, Beta Chain, Quant, S: 30 m[IU]/mL — ABNORMAL HIGH (ref <5)

## 2016-09-25 MED ORDER — OXYCODONE-ACETAMINOPHEN 5-325 MG PO TABS: 1.0000 | ORAL_TABLET | Freq: Four times a day (QID) | ORAL | 0 refills | Status: DC | PRN

## 2016-09-25 NOTE — Discharge Instructions (Signed)

## 2016-09-25 NOTE — MAU Provider Note (Signed)
Ms. Caitlin Good  is a 21 y.o. G1P0 at 1173w5d who presents to MAU today for follow-up quant hCG after 48 hours. She is still having some bleeding, although she states that is lighter now. She is also having intermittent abdominal pain. She has not taken anything for pain today.   BP (!) 107/49 (BP Location: Right Arm)   Pulse 72   Temp 98.1 F (36.7 C)   Resp 18   Ht 5\' 3"  (1.6 m)   Wt 200 lb (90.7 kg)   LMP 08/02/2016   BMI 35.43 kg/m   CONSTITUTIONAL: Well-developed, well-nourished female in no acute distress.  ENT: External right and left ear normal.  EYES: EOM intact, conjunctivae normal.  MUSCULOSKELETAL: Normal range of motion.  CARDIOVASCULAR: Regular heart rate RESPIRATORY: Normal effort NEUROLOGICAL: Alert and oriented to person, place, and time.  SKIN: Skin is warm and dry. No rash noted. Not diaphoretic. No erythema. No pallor. PSYCH: Normal mood and affect. Normal behavior. Normal judgment and thought content.  Results for Caitlin FrohlichSILVA, Myda (MRN [id number]) as of 09/25/2016 02:07  Ref. Range 09/21/2016 12:23 09/21/2016 13:10 09/22/2016 18:40 09/22/2016 19:14 09/24/2016 23:56  HCG, Beta Chain, Quant, S Latest Ref Range: <5 mIU/mL 124 (H)   76 (H) 30 (H)    A: SAB  P: Discharge home Patient will follow-up for labs in 1 week and [facility] Bleeding/ecoptic precautions discussed  Patient may return to MAU as needed or if her condition were to change or worsen   Marny LowensteinWenzel, Julie N, PA-C 09/25/2016 1:15 AM

## 2016-10-04 ENCOUNTER — Other Ambulatory Visit: Payer: Self-pay

## 2016-10-05 ENCOUNTER — Other Ambulatory Visit: Payer: Self-pay

## 2016-10-05 DIAGNOSIS — O039 Complete or unspecified spontaneous abortion without complication: Secondary | ICD-10-CM

## 2016-10-06 LAB — BETA HCG QUANT (REF LAB): hCG Quant: <1 m[IU]/mL

## 2016-10-06 LAB — PLEASE NOTE

## 2016-10-07 ENCOUNTER — Telehealth: Payer: Self-pay | Admitting: *Deleted

## 2016-10-07 NOTE — Telephone Encounter (Signed)
Per message from Dr. Jolayne Pantheronstant need to call patient and tell her lab shows complete resolution of pregnancy. Needs to be seen in office to discuss birth contol options Notes already has appt scheduled for follow up after sab on 10/17/16 at 1:40.    I called Caitlin Good and was unable to leave a message. I called twice , both times phone rings then cuts off- no voicmail picks up.

## 2016-10-10 NOTE — Telephone Encounter (Signed)
Caitlin Good called back 10/07/16 am and left a message she is calling for results. I called Caitlin Good and was unable to leave a message - heard message subscriber has a voicemail that has not been set up.

## 2016-10-11 NOTE — Telephone Encounter (Signed)
mychart message to patient with results and plan.

## 2016-10-17 ENCOUNTER — Encounter: Payer: Self-pay | Admitting: Advanced Practice Midwife

## 2017-02-14 ENCOUNTER — Inpatient Hospital Stay (HOSPITAL_COMMUNITY)
Admission: AD | Admit: 2017-02-14 | Discharge: 2017-02-15 | Disposition: A | Discharge status: Home / Self Care | Payer: Medicaid Other | Source: Ambulatory Visit | Attending: Family Medicine | Admitting: Family Medicine

## 2017-02-14 ENCOUNTER — Encounter (HOSPITAL_COMMUNITY): Payer: Self-pay

## 2017-02-14 DIAGNOSIS — R109 Unspecified abdominal pain: Secondary | ICD-10-CM | POA: Diagnosis present

## 2017-02-14 DIAGNOSIS — N926 Irregular menstruation, unspecified: Secondary | ICD-10-CM

## 2017-02-14 DIAGNOSIS — N912 Amenorrhea, unspecified: Secondary | ICD-10-CM | POA: Diagnosis not present

## 2017-02-14 LAB — URINALYSIS, ROUTINE W REFLEX MICROSCOPIC
Bilirubin Urine: NEGATIVE
Glucose, UA: NEGATIVE mg/dL
Hgb urine dipstick: NEGATIVE
KETONES UR: NEGATIVE mg/dL
LEUKOCYTES UA: NEGATIVE
NITRITE: NEGATIVE
PROTEIN: NEGATIVE mg/dL
Specific Gravity, Urine: 1.027 (ref 1.005–1.030)
pH: 6 (ref 5.0–8.0)

## 2017-02-14 LAB — POCT PREGNANCY, URINE: Preg Test, Ur: NEGATIVE

## 2017-02-14 NOTE — MAU Provider Note (Signed)
  History     CSN: [id number] Arrival date and time: 02/14/17 2159  First Provider Initiated Contact with Patient 02/14/17 2259      Chief Complaint  Patient presents with  . Abdominal Pain    HPI: Caitlin Good is a 21 y.o. G39P0010 female who presents to MAU d/t late menses and abdominal cramping. She reports last menses was August 1st, 2018. Did a home UPT today which was negative. Reports some mild lower abdominal pain for the past 2 days. Has not taken anything for this at home. No alleviating or aggravating factors. Denies any vaginal discharge. She is sexually active with one partner. No on birth control. Also denies fevers, chills, dysuria, hematuria, urinary frequency, nausea, vomiting, other GI or GU symptoms or other general symptoms.  Past obstetric history: OB History  Gravida Para Term Preterm AB Living  1            SAB TAB Ectopic Multiple Live Births          0    # Outcome Date GA Lbr Len/2nd Weight Sex Delivery Anes PTL Lv  1 Gravida               Past Medical History:  Diagnosis Date  . Depression    No recent meds (used Lexapro)    Past Surgical History:  Procedure Laterality Date  . ADENOIDECTOMY    . TONSILLECTOMY      History reviewed. No pertinent family history.  Social History  Substance Use Topics  . Smoking status: Never Smoker  . Smokeless tobacco: Never Used  . Alcohol use No    Allergies: No Known Allergies  Prescriptions Prior to Admission  Medication Sig Dispense Refill Last Dose  . oxyCODONE-acetaminophen (PERCOCET/ROXICET) 5-325 MG tablet Take 1 tablet by mouth every 6 (six) hours as needed for severe pain. 12 tablet 0   . Prenatal Vit-Fe Fumarate-FA (PRENATAL MULTIVITAMIN) TABS tablet Take 1 tablet by mouth daily at 12 noon.   09/21/2016 at Unknown time    Review of Systems - Negative except for what is mentioned in HPI.  Physical Exam   Blood pressure 125/67, pulse 84, temperature 98.2 F (36.8 C), temperature source Oral,  resp. rate 18, height  (1.6 m), weight 201 lb (91.2 kg), last menstrual period 12/07/2016, SpO2 100 %, unknown if currently breastfeeding.  Constitutional: no acute distress.  HENT: V[place]/AT Eyes: normal conjunctivae, no scleral icterus Cardiovascular: normal rate Respiratory: normal WOB GI: Abd soft, non-tender, gravid appropriate for gestational age.   GU: Neg CVAT. Pelvic: NEFG, scant vaginal discharge, no blood, cervix clean. No CMT, uterus not enlarged or tender. Adnexa not palpable d/t body habitus. MSK: no edema, normal ROM Neurologic: Alert and oriented x 4. Psych: Normal mood and affect Skin: warm and dry    MAU Course  Procedures  MDM Exam as above. VS wnl. UPT negative. Wet prep, GC/Chlamydia sent  Discussed ddx of late menses with patient. Discussed medical management and trial of Provera x 10 days for withdrawal bleed. Pt decided for expectant management given onset of mild cramping today, will await menses. Advised follow up with PCP or OBGYN if menses does not come, or if persistently if persistently going more than 2 months w/o menses.  Assessment and Plan  Assessment: 1. Missed menses     Plan: --Discharge home in stable condition.  --Follow up recommended as above   Bama Hanselman, Kandra Nicolas, MD 02/14/2017 11:00 PM

## 2017-02-14 NOTE — MAU Note (Signed)
Pt states she has not had a period since August 1st, 2018. States she had a -upt at home today. States she has sharp, lower abdominal pain, more on right side, that started 2 days ago. Pt denies vaginal bleeding or vaginal discharge.

## 2017-02-15 DIAGNOSIS — N926 Irregular menstruation, unspecified: Secondary | ICD-10-CM

## 2017-02-15 LAB — WET PREP, GENITAL
CLUE CELLS WET PREP: NONE SEEN
SPERM: NONE SEEN
TRICH WET PREP: NONE SEEN
YEAST WET PREP: NONE SEEN

## 2017-02-15 LAB — GC/CHLAMYDIA PROBE AMP (~~LOC~~) NOT AT ARMC
Chlamydia: NEGATIVE
Neisseria Gonorrhea: NEGATIVE

## 2017-02-23 ENCOUNTER — Inpatient Hospital Stay (HOSPITAL_COMMUNITY): Payer: Medicaid Other

## 2017-02-23 ENCOUNTER — Inpatient Hospital Stay (HOSPITAL_COMMUNITY)
Admission: AD | Admit: 2017-02-23 | Discharge: 2017-02-23 | Disposition: A | Discharge status: Home / Self Care | Payer: Medicaid Other | Source: Ambulatory Visit | Attending: Obstetrics & Gynecology | Admitting: Obstetrics & Gynecology

## 2017-02-23 ENCOUNTER — Encounter (HOSPITAL_COMMUNITY): Payer: Self-pay | Admitting: *Deleted

## 2017-02-23 DIAGNOSIS — O26899 Other specified pregnancy related conditions, unspecified trimester: Secondary | ICD-10-CM

## 2017-02-23 DIAGNOSIS — Z3A01 Less than 8 weeks gestation of pregnancy: Secondary | ICD-10-CM | POA: Diagnosis not present

## 2017-02-23 DIAGNOSIS — O3680X0 Pregnancy with inconclusive fetal viability, not applicable or unspecified: Secondary | ICD-10-CM

## 2017-02-23 DIAGNOSIS — O26891 Other specified pregnancy related conditions, first trimester: Secondary | ICD-10-CM | POA: Diagnosis not present

## 2017-02-23 DIAGNOSIS — R102 Pelvic and perineal pain: Secondary | ICD-10-CM | POA: Insufficient documentation

## 2017-02-23 DIAGNOSIS — R1030 Lower abdominal pain, unspecified: Secondary | ICD-10-CM | POA: Diagnosis present

## 2017-02-23 LAB — URINALYSIS, ROUTINE W REFLEX MICROSCOPIC
BILIRUBIN URINE: NEGATIVE
GLUCOSE, UA: NEGATIVE mg/dL
Hgb urine dipstick: NEGATIVE
KETONES UR: NEGATIVE mg/dL
LEUKOCYTES UA: NEGATIVE
NITRITE: NEGATIVE
PH: 5 (ref 5.0–8.0)
Protein, ur: NEGATIVE mg/dL
Specific Gravity, Urine: 1.027 (ref 1.005–1.030)

## 2017-02-23 LAB — CBC
HCT: 35 % — ABNORMAL LOW (ref 36.0–46.0)
Hemoglobin: 12.2 g/dL (ref 12.0–15.0)
MCH: 28.6 pg (ref 26.0–34.0)
MCHC: 34.9 g/dL (ref 30.0–36.0)
MCV: 82 fL (ref 78.0–100.0)
Platelets: 334 10*3/uL (ref 150–400)
RBC: 4.27 MIL/uL (ref 3.87–5.11)
RDW: 13.8 % (ref 11.5–15.5)
WBC: 12.1 10*3/uL — ABNORMAL HIGH (ref 4.0–10.5)

## 2017-02-23 LAB — POCT PREGNANCY, URINE: Preg Test, Ur: POSITIVE — AB

## 2017-02-23 LAB — HCG, QUANTITATIVE, PREGNANCY: hCG, Beta Chain, Quant, S: 889 m[IU]/mL — ABNORMAL HIGH (ref <5)

## 2017-02-23 NOTE — MAU Note (Addendum)
PT SAYS ABD PAIN STARTED   MON AND WORSE  AT 2 PM TODAY.     SHE CALLED [facility] -  YESTERDAY.     SHE CALLED [facility]- FOR AN APPOINTMENT -  TOLD  HER  NEEDS MONEY.   SO WENT   TO CLINIC  TODAY - TOLD  HER COULD   NOT SEE HER UNTIL DEC.        LAST SEX-  2 WEEKS  AGO.   NO MEDS  FOR PAIN

## 2017-02-23 NOTE — MAU Provider Note (Signed)
Chief Complaint: Abdominal Pain   First Provider Initiated Contact with Patient 02/23/17 2123        SUBJECTIVE HPI: Caitlin Good is a 21 y.o. G1P0010 at Unknown by LMP who presents to maternity admissions reporting pain in lower abdomen since Monday.  Got worse today.  Was seen 10 days ago and UPT was negative. It is positive today. . She denies vaginal bleeding, vaginal itching/burning, urinary symptoms, h/a, dizziness, n/v, or fever/chills.    Abdominal Pain  This is a recurrent problem. The current episode started in the past 7 days. The onset quality is gradual. The problem occurs intermittently. The problem has been unchanged. The pain is located in the LLQ, RLQ and suprapubic region. The quality of the pain is cramping. The abdominal pain does not radiate. Pertinent negatives include no constipation, diarrhea, dysuria, fever, headaches, myalgias, nausea or vomiting. Nothing aggravates the pain. The pain is relieved by nothing. She has tried nothing for the symptoms.    RN Note: PT SAYS ABD PAIN STARTED   MON AND WORSE  AT 2 PM TODAY.     SHE CALLED [facility] -  YESTERDAY.     SHE CALLED [facility]- FOR AN APPOINTMENT -  TOLD  HER  NEEDS MONEY.   SO WENT   TO CLINIC  TODAY - TOLD  HER COULD   NOT SEE HER UNTIL DEC.        LAST SEX-  2 WEEKS  AGO.   NO MEDS  FOR PAIN   Past Medical History:  Diagnosis Date  . Depression    No recent meds (used Lexapro)   Past Surgical History:  Procedure Laterality Date  . ADENOIDECTOMY    . TONSILLECTOMY     Social History   Social History  . Marital status: Single    Spouse name: N/A  . Number of children: N/A  . Years of education: N/A   Occupational History  . Not on file.   Social History Main Topics  . Smoking status: Never Smoker  . Smokeless tobacco: Never Used  . Alcohol use No  . Drug use: No  . Sexual activity: Yes    Birth control/ protection: None   Other Topics Concern  . Not on file   Social History  Narrative  . No narrative on file   No current facility-administered medications on file prior to encounter.    Current Outpatient Prescriptions on File Prior to Encounter  Medication Sig Dispense Refill  . Prenatal Vit-Fe Fumarate-FA (PRENATAL MULTIVITAMIN) TABS tablet Take 1 tablet by mouth daily at 12 noon.    Marland Kitchen oxyCODONE-acetaminophen (PERCOCET/ROXICET) 5-325 MG tablet Take 1 tablet by mouth every 6 (six) hours as needed for severe pain. 12 tablet 0   No Known Allergies  I have reviewed patient's Past Medical Hx, Surgical Hx, Family Hx, Social Hx, medications and allergies.   ROS:  Review of Systems  Constitutional: Negative for fever.  Gastrointestinal: Positive for abdominal pain. Negative for constipation, diarrhea, nausea and vomiting.  Genitourinary: Negative for dysuria.  Musculoskeletal: Negative for myalgias.  Neurological: Negative for headaches.   Review of Systems  Other systems negative   Physical Exam  Physical Exam Patient Vitals for the past 24 hrs:  BP Temp Temp src Pulse Resp Height Weight  02/23/17 2115 (!) 118/56 98.2 F (36.8 C) Oral 82 20 5\' 3"  (1.6 m) 202 lb 12 oz (92 kg)   Constitutional: Well-developed, well-nourished female in no acute distress.  Cardiovascular: normal rate  Respiratory: normal effort GI: Abd soft, non-tender. Pos BS x 4 MS: Extremities nontender, no edema, normal ROM Neurologic: Alert and oriented x 4.  GU: Neg CVAT.  PELVIC EXAM: Cervix pink, visually closed, without lesion, scant white creamy discharge, vaginal walls and external genitalia normal Bimanual exam: Cervix 0/long/high, firm, anterior, neg CMT, uterus nontender, nonenlarged, adnexa without tenderness, enlargement, or mass   LAB RESULTS Results for orders placed or performed during the hospital encounter of 02/23/17 (from the past 24 hour(s))  Urinalysis, Routine w reflex microscopic (not at [facility])     Status: None   Collection Time: 02/23/17  9:17 PM  Result  Value Ref Range   Color, Urine YELLOW YELLOW   APPearance CLEAR CLEAR   Specific Gravity, Urine 1.027 1.005 - 1.030   pH 5.0 5.0 - 8.0   Glucose, UA NEGATIVE NEGATIVE mg/dL   Hgb urine dipstick NEGATIVE NEGATIVE   Bilirubin Urine NEGATIVE NEGATIVE   Ketones, ur NEGATIVE NEGATIVE mg/dL   Protein, ur NEGATIVE NEGATIVE mg/dL   Nitrite NEGATIVE NEGATIVE   Leukocytes, UA NEGATIVE NEGATIVE  Pregnancy, urine POC     Status: Abnormal   Collection Time: 02/23/17  9:22 PM  Result Value Ref Range   Preg Test, Ur POSITIVE (A) NEGATIVE  CBC     Status: Abnormal   Collection Time: 02/23/17  9:38 PM  Result Value Ref Range   WBC 12.1 (H) 4.0 - 10.5 K/uL   RBC 4.27 3.87 - 5.11 MIL/uL   Hemoglobin 12.2 12.0 - 15.0 g/dL   HCT 16.1 (L) 09.6 - 04.5 %   MCV 82.0 78.0 - 100.0 fL   MCH 28.6 26.0 - 34.0 pg   MCHC 34.9 30.0 - 36.0 g/dL   RDW 40.9 81.1 - 91.4 %   Platelets 334 150 - 400 K/uL  hCG, quantitative, pregnancy     Status: Abnormal   Collection Time: 02/23/17  9:38 PM  Result Value Ref Range   hCG, Beta Chain, Quant, S 889 (H) <5 mIU/mL    --/--/A POS (05/16 1223)  IMAGING US Ob Comp Less 14 Wks  Result Date: 02/23/2017 CLINICAL DATA:  21 y/o F; abdominal pain. Pregnancy test in process. EXAM: OBSTETRIC <14 WK [place] AND TRANSVAGINAL OB US TECHNIQUE: Both transabdominal and transvaginal ultrasound examinations were performed for complete evaluation of the gestation as well as the maternal uterus, adnexal regions, and pelvic cul-de-sac. Transvaginal technique was performed to assess early pregnancy. COMPARISON:  09/21/2016 ultrasound of the pelvis. FINDINGS: Intrauterine gestational sac: Single Yolk sac:  Not Visualized. Embryo:  Not Visualized. Cardiac Activity: Not Visualized. MSD: 2  mm   4 w   6  d Subchorionic hemorrhage:  None visualized. Maternal uterus/adnexae: Right ovary measures 4 x 3.0 x 2.7 cm and there is a small cyst, probably corpus luteum. The left ovary measures 2.8 x 1.3 x 1.8  cm IMPRESSION: Probable early intrauterine gestational sac, but no yolk sac, fetal pole, or cardiac activity yet visualized. Recommend follow-up quantitative B-HCG levels and follow-up US in 14 days to assess viability. This recommendation follows SRU consensus guidelines: Diagnostic Criteria for Nonviable Pregnancy Early in the First Trimester. Malva Limes Med 2013; 782:9562-13. Electronically Signed   By: Mitzi Hansen M.D.   On: 02/23/2017 22:14   US Ob Transvaginal  Result Date: 02/23/2017 CLINICAL DATA:  21 y/o F; abdominal pain. Pregnancy test in process. EXAM: OBSTETRIC <14 WK [place] AND TRANSVAGINAL OB US TECHNIQUE: Both transabdominal and transvaginal ultrasound examinations were performed  for complete evaluation of the gestation as well as the maternal uterus, adnexal regions, and pelvic cul-de-sac. Transvaginal technique was performed to assess early pregnancy. COMPARISON:  09/21/2016 ultrasound of the pelvis. FINDINGS: Intrauterine gestational sac: Single Yolk sac:  Not Visualized. Embryo:  Not Visualized. Cardiac Activity: Not Visualized. MSD: 2  mm   4 w   6  d Subchorionic hemorrhage:  None visualized. Maternal uterus/adnexae: Right ovary measures 4 x 3.0 x 2.7 cm and there is a small cyst, probably corpus luteum. The left ovary measures 2.8 x 1.3 x 1.8 cm IMPRESSION: Probable early intrauterine gestational sac, but no yolk sac, fetal pole, or cardiac activity yet visualized. Recommend follow-up quantitative B-HCG levels and follow-up US in 14 days to assess viability. This recommendation follows SRU consensus guidelines: Diagnostic Criteria for Nonviable Pregnancy Early in the First Trimester. Malva Limes Engl J Med 2013; 045:4098-11; 369:1443-51. Electronically Signed   By: Mitzi HansenLance  Furusawa-Stratton M.D.   On: 02/23/2017 22:14    MAU Management/MDM: Ordered usual first trimester r/o ectopic labs.   Pelvic exam and cultures done on last visit Will check baseline Ultrasound to rule out ectopic.  This  bleeding/pain can represent a normal pregnancy with bleeding, spontaneous abortion or even an ectopic which can be life-threatening.  The process as listed above helps to determine which of these is present.  Ultrasound shows probable IUGS, no yolk sac, no evidence of ectopic pregnancy. BHCG 800. Likely early IUP but can't r/o ectopic at this time. Will have patient return for f/u BHCG  ASSESSMENT 1. Pregnancy of unknown anatomic location   2. Pelvic cramping in antepartum period      PLAN Care turned over to oncoming provider.  Wynelle BourgeoisMarie Williams CNM, MSN Certified Nurse-Midwife 02/23/2017  9:23 PM   P: Discharge home Discussed reasons to return to MAU for worsening pain or heavy bleeding Report to MAU Sunday morning for repeat BHCG  Judeth HornLawrence, Marquelle Musgrave, NP

## 2017-02-23 NOTE — Discharge Instructions (Signed)
Return to care  °· If you have heavier bleeding that soaks through more that 2 pads per hour for an hour or more °· If you bleed so much that you feel like you might pass out or you do pass out °· If you have significant abdominal pain that is not improved with Tylenol  °· If you develop a fever > 100.5 ° ° °Abdominal Pain During Pregnancy °Abdominal pain is common in pregnancy. Most of the time, it does not cause harm. There are many causes of abdominal pain. Some causes are more serious than others and sometimes the cause is not known. Abdominal pain can be a sign that something is very wrong with the pregnancy or the pain may have nothing to do with the pregnancy. Always tell your health care provider if you have any abdominal pain. °Follow these instructions at home: °· Do not have sex or put anything in your vagina until your symptoms go away completely. °· Watch your abdominal pain for any changes. °· Get plenty of rest until your pain improves. °· Drink enough fluid to keep your urine clear or pale yellow. °· Take over-the-counter or prescription medicines only as told by your health care provider. °· Keep all follow-up visits as told by your health care provider. This is important. °Contact a health care provider if: °· You have a fever. °· Your pain gets worse or you have cramping. °· Your pain continues after resting. °Get help right away if: °· You are bleeding, leaking fluid, or passing tissue from the vagina. °· You have vomiting or diarrhea that does not go away. °· You have painful or bloody urination. °· You notice a decrease in your baby's movements. °· You feel very weak or faint. °· You have shortness of breath. °· You develop a severe headache with abdominal pain. °· You have abnormal vaginal discharge with abdominal pain. °This information is not intended to replace advice given to you by your health care provider. Make sure you discuss any questions you have with your health care  provider. °Document Released: 04/25/2005 Document Revised: 02/04/2016 Document Reviewed: 11/22/2012 °Elsevier Interactive Patient Education © 2018 Elsevier Inc. ° °

## 2017-02-26 ENCOUNTER — Inpatient Hospital Stay (HOSPITAL_COMMUNITY)
Admission: AD | Admit: 2017-02-26 | Discharge: 2017-02-26 | Disposition: A | Discharge status: Home / Self Care | Payer: Medicaid Other | Source: Ambulatory Visit | Attending: Obstetrics & Gynecology | Admitting: Obstetrics & Gynecology

## 2017-02-26 DIAGNOSIS — O26891 Other specified pregnancy related conditions, first trimester: Secondary | ICD-10-CM | POA: Diagnosis not present

## 2017-02-26 DIAGNOSIS — Z3A10 10 weeks gestation of pregnancy: Secondary | ICD-10-CM | POA: Diagnosis not present

## 2017-02-26 DIAGNOSIS — R102 Pelvic and perineal pain: Secondary | ICD-10-CM | POA: Insufficient documentation

## 2017-02-26 DIAGNOSIS — O3680X0 Pregnancy with inconclusive fetal viability, not applicable or unspecified: Secondary | ICD-10-CM

## 2017-02-26 LAB — HCG, QUANTITATIVE, PREGNANCY: HCG, BETA CHAIN, QUANT, S: 3347 m[IU]/mL — AB (ref <5)

## 2017-02-26 NOTE — MAU Provider Note (Signed)
  History     CSN: [id number]  Arrival date and time: 02/26/17 1413   None     Chief Complaint  Patient presents with  . Follow-up   Caitlin Good is a 21 y.o. G2P0010 at 4565w3d who presents today for FU HCG. She denies any pain or bleeding today. She was seen for +UPT and pain on 02/23/17. No IUP seen on US. Instructed to return for repeat HCG today.    Pelvic Pain  The patient's primary symptoms include pelvic pain. The patient's pertinent negatives include no vaginal discharge. This is a new problem. The current episode started in the past 7 days. The problem occurs intermittently. The problem has been resolved. The patient is experiencing no pain. She is pregnant. Pertinent negatives include no chills, fever, nausea or vomiting. The vaginal discharge was normal. There has been no bleeding. Nothing aggravates the symptoms. She has tried nothing for the symptoms.    Past Medical History:  Diagnosis Date  . Depression    No recent meds (used Lexapro)    Past Surgical History:  Procedure Laterality Date  . ADENOIDECTOMY    . TONSILLECTOMY      No family history on file.  Social History  Substance Use Topics  . Smoking status: Never Smoker  . Smokeless tobacco: Never Used  . Alcohol use No    Allergies: No Known Allergies  Prescriptions Prior to Admission  Medication Sig Dispense Refill Last Dose  . Prenatal Vit-Fe Fumarate-FA (PRENATAL MULTIVITAMIN) TABS tablet Take 1 tablet by mouth daily at 12 noon.   02/22/2017 at Unknown time    Review of Systems  Constitutional: Negative for chills and fever.  Gastrointestinal: Negative for nausea and vomiting.  Genitourinary: Positive for pelvic pain. Negative for vaginal bleeding and vaginal discharge.   Physical Exam   Blood pressure (!) 114/59, pulse 78, temperature 98.3 F (36.8 C), temperature source Oral, resp. rate 18, last menstrual period 12/15/2016, SpO2 100 %, unknown if currently breastfeeding.  Physical Exam   Nursing note and vitals reviewed. Constitutional: She is oriented to person, place, and time. She appears well-developed and well-nourished. No distress.  HENT:  Head: Normocephalic.  Cardiovascular: Normal rate.   Respiratory: Effort normal.  GI: Soft. There is no tenderness. There is no rebound.  Neurological: She is alert and oriented to person, place, and time.  Skin: Skin is warm and dry.  Psychiatric: She has a normal mood and affect.     Results for Caitlin FrohlichSILVA, Velicia (MRN [id number]) as of 02/26/2017 15:31  Ref. Range 02/23/2017 21:38 02/23/2017 22:09 02/26/2017 14:18  HCG, Beta Chain, Quant, S Latest Ref Range: <5 mIU/mL 889 (H)  3,347 (H)   MAU Course  Procedures  MDM   Assessment and Plan   1. Pregnancy, location unknown   2. Pregnancy of unknown anatomic location    DC home HCG has more than doubled in 48 hours, will get repeat US this week  1st Trimester precautions  Ectopic precautions RX: none, outpatient US ordered  Return to MAU as needed  Follow-up Information    [facility] Follow up.   Specialty:  Radiology Why:  They will call you with an appointment  Contact information: [address] [id number] [place] [id number] [phone]           Thressa ShellerHeather Hogan 02/26/2017, 3:32 PM

## 2017-02-26 NOTE — MAU Note (Signed)
Presents for repeat labs.  No complaints

## 2017-02-26 NOTE — Discharge Instructions (Signed)
Ectopic Pregnancy An ectopic pregnancy is when the fertilized egg attaches (implants) outside the uterus. Most ectopic pregnancies occur in one of the tubes where eggs travel from the ovary to the uterus (fallopian tubes), but the implanting can occur in other locations. In rare cases, ectopic pregnancies occur on the ovary, intestine, pelvis, abdomen, or cervix. In an ectopic pregnancy, the fertilized egg does not have the ability to develop into a normal, healthy baby. A ruptured ectopic pregnancy is one in which tearing or bursting of a fallopian tube causes internal bleeding. Often, there is intense lower abdominal pain, and vaginal bleeding sometimes occurs. Having an ectopic pregnancy can be life-threatening. If this dangerous condition is not treated, it can lead to blood loss, shock, or even death. What are the causes? The most common cause of this condition is damage to one of the fallopian tubes. A fallopian tube may be narrowed or blocked, and that keeps the fertilized egg from reaching the uterus. What increases the risk? This condition is more likely to develop in women of childbearing age who have different levels of risk. The levels of risk can be divided into three categories. High risk  You have gone through infertility treatment.  You have had an ectopic pregnancy before.  You have had surgery on the fallopian tubes, or another surgical procedure, such as an abortion.  You have had surgery to have the fallopian tubes tied (tubal ligation).  You have problems or diseases of the fallopian tubes.  You have been exposed to diethylstilbestrol (DES). This medicine was used until 1971, and it had effects on babies whose mothers took the medicine.  You become pregnant while using an IUD (intrauterine device) for birth control. Moderate risk  You have a history of infertility.  You have had an STI (sexually transmitted infection).  You have a history of pelvic inflammatory  disease (PID).  You have scarring from endometriosis.  You have multiple sexual partners.  You smoke. Low risk  You have had pelvic surgery.  You use vaginal douches.  You became sexually active before age 18. What are the signs or symptoms? Common symptoms of this condition include normal pregnancy symptoms, such as missing a period, nausea, tiredness, abdominal pain, breast tenderness, and bleeding. However, ectopic pregnancy will have additional symptoms, such as:  Pain with intercourse.  Irregular vaginal bleeding or spotting.  Cramping or pain on one side or in the lower abdomen.  Fast heartbeat, low blood pressure, and sweating.  Passing out while having a bowel movement.  Symptoms of a ruptured ectopic pregnancy and internal bleeding may include:  Sudden, severe pain in the abdomen and pelvis.  Dizziness, weakness, light-headedness, or fainting.  Pain in the shoulder or neck area.  How is this diagnosed? This condition is diagnosed by:  A pelvic exam to locate pain or a mass in the abdomen.  A pregnancy test. This blood test checks for the presence as well as the specific level of pregnancy hormone in the bloodstream.  Ultrasound. This is performed if a pregnancy test is positive. In this test, a probe is inserted into the vagina. The probe will detect a fetus, possibly in a location other than the uterus.  Taking a sample of uterus tissue (dilation and curettage, or D&C).  Surgery to perform a visual exam of the inside of the abdomen using a thin, lighted tube that has a tiny camera on the end (laparoscope).  Culdocentesis. This procedure involves inserting a needle at the top   of the vagina, behind the uterus. If blood is present in this area, it may indicate that a fallopian tube is torn.  How is this treated? This condition is treated with medicine or surgery. Medicine  An injection of a medicine (methotrexate) may be given to cause the pregnancy tissue  to be absorbed. This medicine may save your fallopian tube. It may be given if: ? The diagnosis is made early, with no signs of active bleeding. ? The fallopian tube has not ruptured. ? You are considered to be a good candidate for the medicine. Usually, pregnancy hormone blood levels are checked after methotrexate treatment. This is to be sure that the medicine is effective. It may take 4-6 weeks for the pregnancy to be absorbed. Most pregnancies will be absorbed by 3 weeks. Surgery  A laparoscope may be used to remove the pregnancy tissue.  If severe internal bleeding occurs, a larger cut (incision) may be made in the lower abdomen (laparotomy) to remove the fetus and placenta. This is done to stop the bleeding.  Part or all of the fallopian tube may be removed (salpingectomy) along with the fetus and placenta. The fallopian tube may also be repaired during the surgery.  In very rare circumstances, removal of the uterus (hysterectomy) may be required.  After surgery, pregnancy hormone testing may be done to be sure that there is no pregnancy tissue left. Whether your treatment is medicine or surgery, you may receive a Rho (D) immune globulin shot to prevent problems with any future pregnancy. This shot may be given if:  You are Rh-negative and the baby's father is Rh-positive.  You are Rh-negative and you do not know the Rh type of the baby's father.  Follow these instructions at home:  Rest and limit your activity after the procedure for as long as told by your health care provider.  Until your health care provider says that it is safe: ? Do not lift anything that is heavier than 10 lb (4.5 kg), or the limit that your health care provider tells you. ? Avoid physical exercise and any movement that requires effort (is strenuous).  To help prevent constipation: ? Eat a healthy diet that includes fruits, vegetables, and whole grains. ? Drink 6-8 glasses of water per day. Get help  right away if:  You develop worsening pain that is not relieved by medicine.  You have: ? A fever or chills. ? Vaginal bleeding. ? Redness and swelling at the incision site. ? Nausea and vomiting.  You feel dizzy or weak.  You feel light-headed or you faint. This information is not intended to replace advice given to you by your health care provider. Make sure you discuss any questions you have with your health care provider. Document Released: 06/02/2004 Document Revised: 12/23/2015 Document Reviewed: 11/25/2015 Elsevier Interactive Patient Education  2018 [employer]  

## 2017-03-17 ENCOUNTER — Ambulatory Visit (HOSPITAL_COMMUNITY): Payer: Medicaid Other

## 2017-03-24 LAB — OB RESULTS CONSOLE HEPATITIS B SURFACE ANTIGEN: HEP B S AG: NEGATIVE

## 2017-03-24 LAB — OB RESULTS CONSOLE RUBELLA ANTIBODY, IGM: RUBELLA: IMMUNE

## 2017-03-24 LAB — OB RESULTS CONSOLE ANTIBODY SCREEN: Antibody Screen: NEGATIVE

## 2017-03-24 LAB — OB RESULTS CONSOLE ABO/RH: RH Type: POSITIVE

## 2017-03-24 LAB — OB RESULTS CONSOLE GC/CHLAMYDIA
CHLAMYDIA, DNA PROBE: NEGATIVE
Gonorrhea: NEGATIVE

## 2017-03-24 LAB — OB RESULTS CONSOLE RPR: RPR: NONREACTIVE

## 2017-03-24 LAB — OB RESULTS CONSOLE HIV ANTIBODY (ROUTINE TESTING): HIV: NONREACTIVE

## 2017-05-09 NOTE — L&D Delivery Note (Signed)
Delivery Note Pt complete and feeling pressure. She pushed for approx 1.5hrs and at 10:34 PM a viable female was delivered via Vaginal, Spontaneous (Presentation:ROA then rotated OA and then LOA ).  APGAR: 6, 8; weight pending .   Placenta status: delivered intact, schultz.  Cord: 3vc with the following complications:nuchal x 1 .  Cord pH: pending  Anesthesia:  Epidural Episiotomy: None Lacerations: 2nd degree;Perineal Suture Repair: 2.0 vicryl and 4-0 vicryl Est. Blood Loss (mL): 250  Mom to postpartum.  Baby to Couplet care / Skin to Skin.  Cathrine MusterCecilia W Shakaya Bhullar 10/23/2017, 11:03 PM

## 2017-06-16 DIAGNOSIS — O99210 Obesity complicating pregnancy, unspecified trimester: Secondary | ICD-10-CM | POA: Insufficient documentation

## 2017-06-16 DIAGNOSIS — Z2839 Other underimmunization status: Secondary | ICD-10-CM | POA: Insufficient documentation

## 2017-06-16 DIAGNOSIS — O09899 Supervision of other high risk pregnancies, unspecified trimester: Secondary | ICD-10-CM | POA: Insufficient documentation

## 2017-09-16 IMAGING — CT CT HEAD W/O CM
3 series · 15 of 47 positions shown, 18 images · non-contrast
Comparison: None.

CLINICAL DATA: Subacute onset of mid to posterior headache and
mildly blurred vision. Mild right-sided weakness. Initial encounter.

EXAM:
CT HEAD WITHOUT CONTRAST
TECHNIQUE: Contiguous axial images were obtained from the base of the skull
through the vertex without intravenous contrast.

[Series 2: head 5.0 h30s · axial · 0.40mm/px · z∈[-170,-45]mm · 9 of 30 slices shown, 12 images]
[im 3/30  brain]
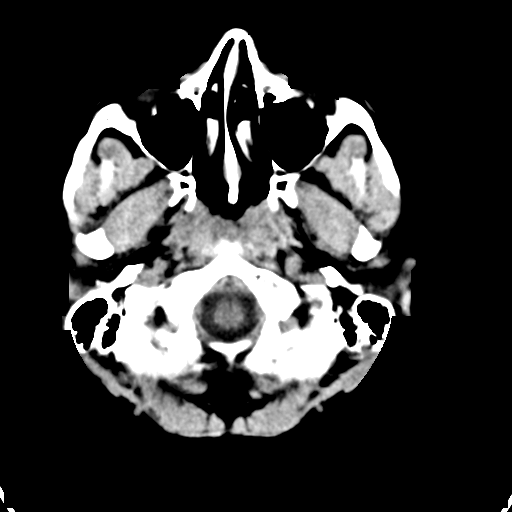
[im 3/30  bone]
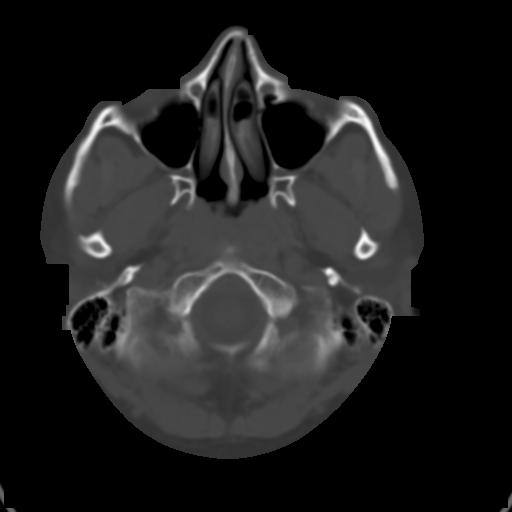
[im 6/30  brain]
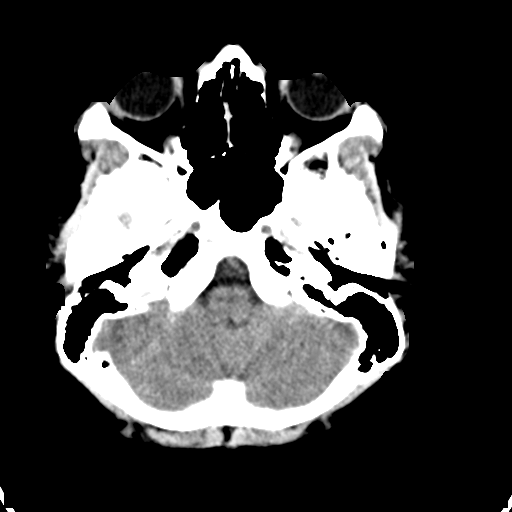
[im 9/30  brain]
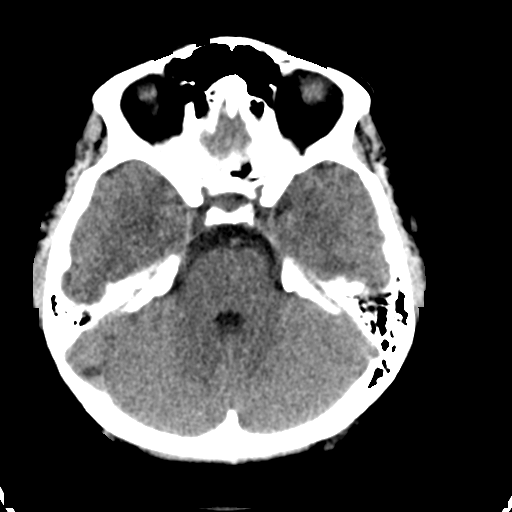
[im 12/30  brain]
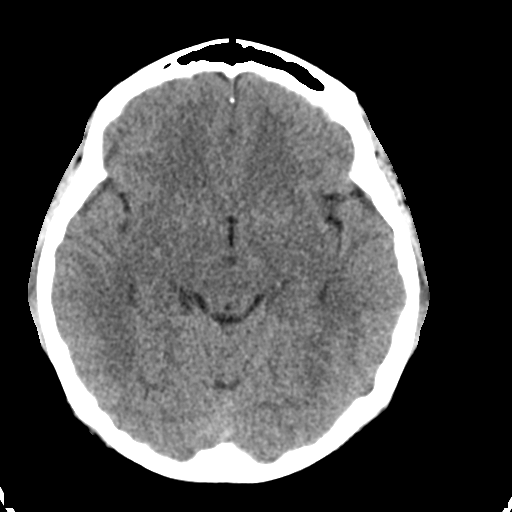
[im 16/30  brain]
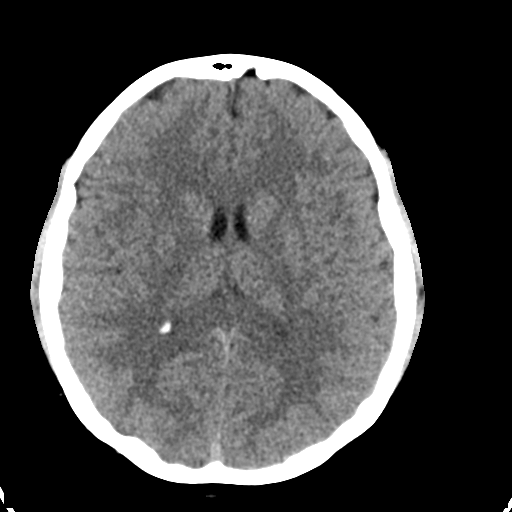
[im 16/30  bone]
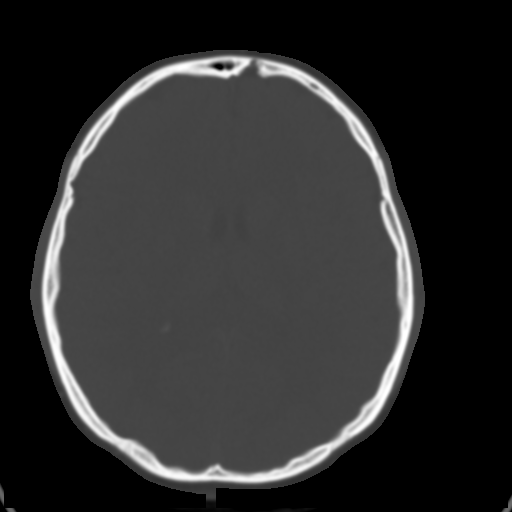
[im 19/30  brain]
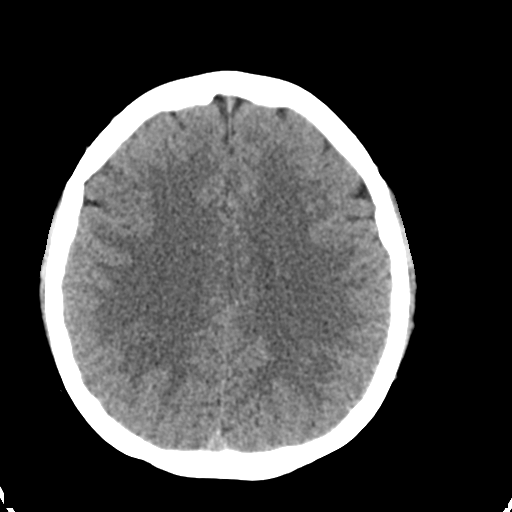
[im 22/30  brain]
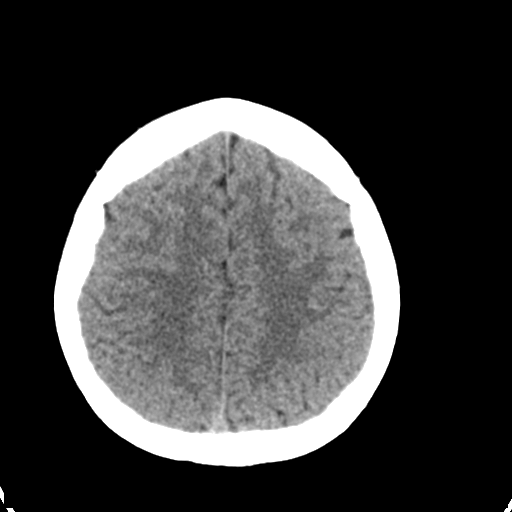
[im 25/30  brain]
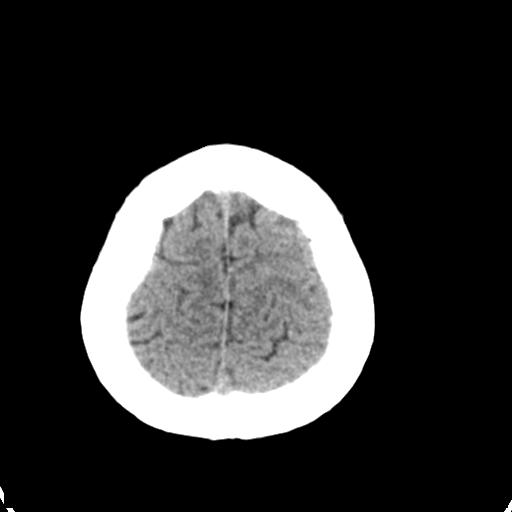
[im 28/30  brain]
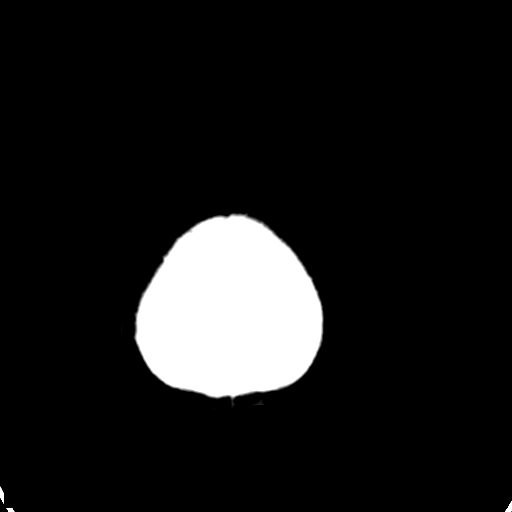
[im 28/30  bone]
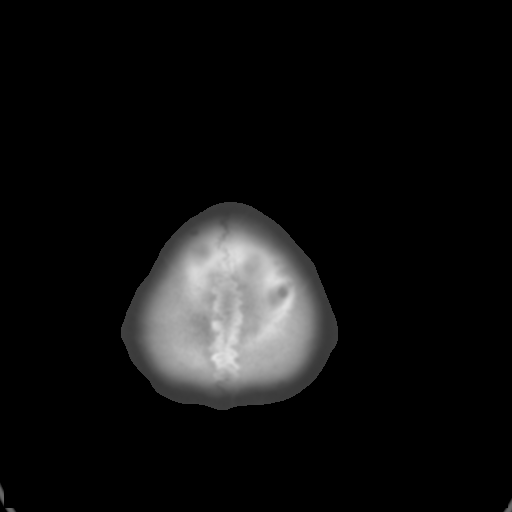

[Series 4: head 3.0 mpr · coronal · 0.29mm/px · 3 of 66 slices shown (1 of 2)]
[im 22/66  brain]
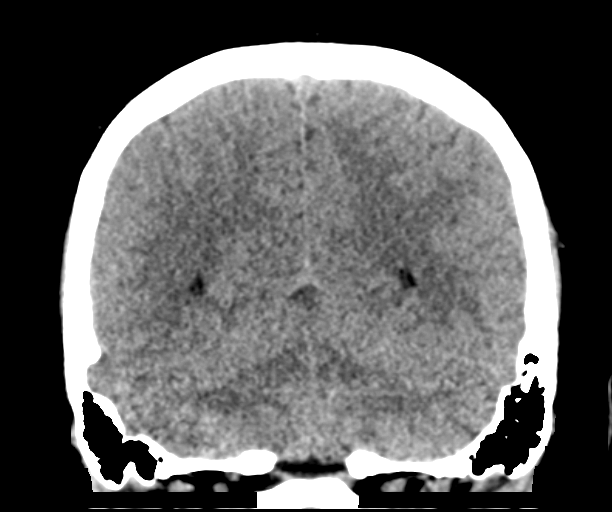
[im 29/66  brain]
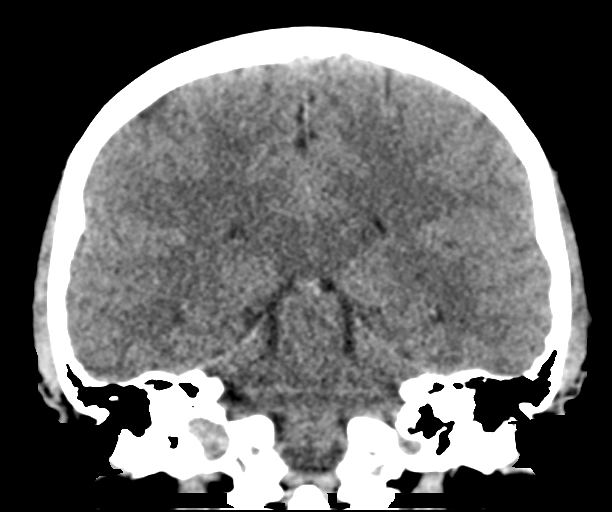
[im 37/66  brain]
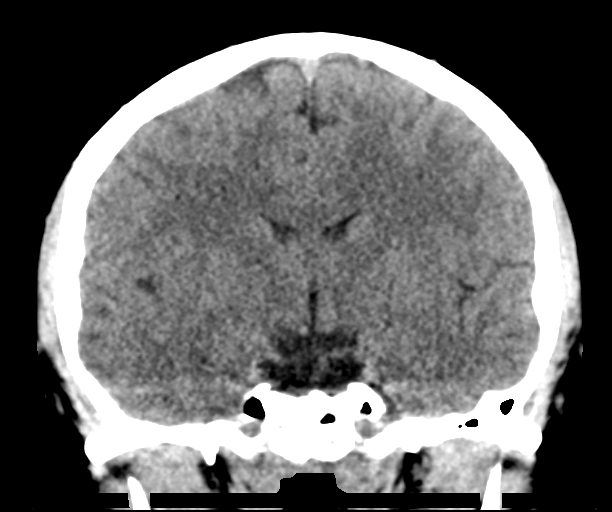

[Series 5: head 3.0 mpr · sagittal · 0.29mm/px · 3 of 59 slices shown (2 of 2)]
[im 20/59  brain]
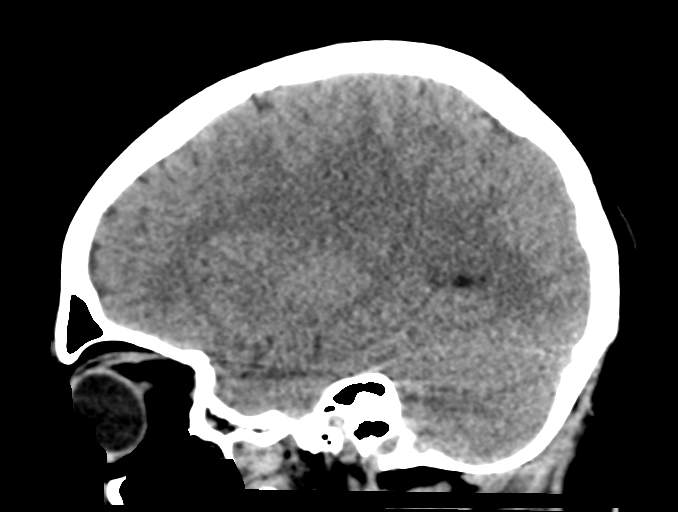
[im 30/59  brain]
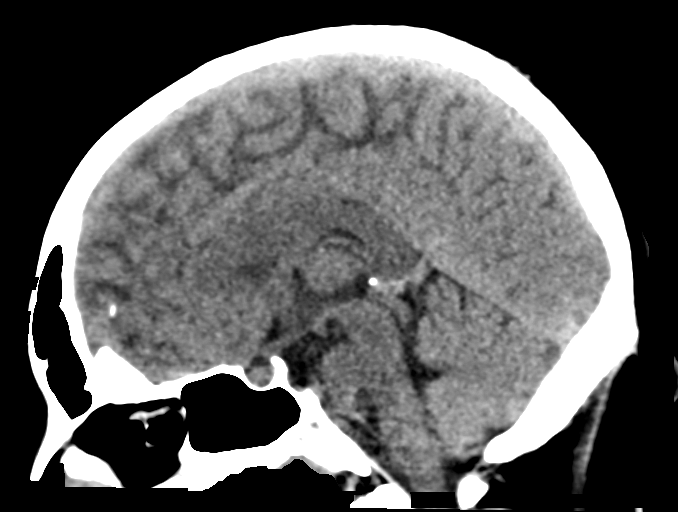
[im 39/59  brain]
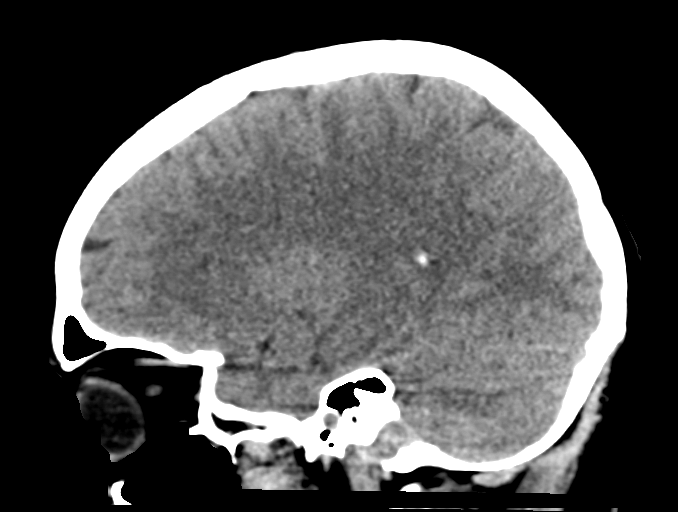

[15 of 47 positions shown; findings below may reference images not displayed]

FINDINGS: Brain: No evidence of acute infarction, hemorrhage, hydrocephalus,
extra-axial collection or mass lesion/mass effect.

The posterior fossa, including the cerebellum, brainstem and fourth
ventricle, is within normal limits. The third and lateral
ventricles, and basal ganglia are unremarkable in appearance. The
cerebral hemispheres are symmetric in appearance, with normal
gray-white differentiation. No mass effect or midline shift is seen.

Vascular: No hyperdense vessel or unexpected calcification.

Skull: There is no evidence of fracture; visualized osseous
structures are unremarkable in appearance.

Sinuses/Orbits: The visualized portions of the orbits are within
normal limits. The paranasal sinuses and mastoid air cells are
well-aerated.

Other: No significant soft tissue abnormalities are seen.
IMPRESSION: Unremarkable noncontrast CT of the head.

## 2017-09-20 ENCOUNTER — Encounter: Payer: Medicaid Other | Attending: Obstetrics and Gynecology | Admitting: Registered"

## 2017-09-20 DIAGNOSIS — Z713 Dietary counseling and surveillance: Secondary | ICD-10-CM | POA: Diagnosis not present

## 2017-09-20 DIAGNOSIS — O24419 Gestational diabetes mellitus in pregnancy, unspecified control: Secondary | ICD-10-CM | POA: Diagnosis present

## 2017-09-21 ENCOUNTER — Encounter: Payer: Self-pay | Admitting: Registered"

## 2017-09-21 DIAGNOSIS — O24419 Gestational diabetes mellitus in pregnancy, unspecified control: Secondary | ICD-10-CM | POA: Insufficient documentation

## 2017-09-21 NOTE — Progress Notes (Signed)
Patient was seen on 09/20/2017 for Gestational Diabetes self-management class at the [facility]. The following learning objectives were met by the patient during this course:   States the definition of Gestational Diabetes  States why dietary management is important in controlling blood glucose  Describes the effects each nutrient has on blood glucose levels  Demonstrates ability to create a balanced meal plan  Demonstrates carbohydrate counting   States when to check blood glucose levels  Demonstrates proper blood glucose monitoring techniques  States the effect of stress and exercise on blood glucose levels  States the importance of limiting caffeine and abstaining from alcohol and smoking  Blood glucose monitor given: Accu-Chek Guide Lot # [id number] Exp: 09/16/18 Blood glucose reading: 72  Patient instructed to monitor glucose levels: FBS: 60 - <95; 1 hour: <140; 2 hour: <120  Patient received handouts:  Nutrition Diabetes and Pregnancy, including carb counting list  Patient will be seen for follow-up as needed.

## 2017-09-28 LAB — OB RESULTS CONSOLE GBS: GBS: NEGATIVE

## 2017-10-06 ENCOUNTER — Inpatient Hospital Stay (HOSPITAL_COMMUNITY)
Admission: AD | Admit: 2017-10-06 | Discharge: 2017-10-06 | Disposition: A | Discharge status: Home / Self Care | Payer: Medicaid Other | Source: Ambulatory Visit | Attending: Obstetrics and Gynecology | Admitting: Obstetrics and Gynecology

## 2017-10-06 ENCOUNTER — Encounter (HOSPITAL_COMMUNITY): Payer: Self-pay | Admitting: *Deleted

## 2017-10-06 DIAGNOSIS — Z029 Encounter for administrative examinations, unspecified: Secondary | ICD-10-CM | POA: Insufficient documentation

## 2017-10-06 DIAGNOSIS — O479 False labor, unspecified: Secondary | ICD-10-CM

## 2017-10-06 LAB — URINALYSIS, ROUTINE W REFLEX MICROSCOPIC
Bilirubin Urine: NEGATIVE
Glucose, UA: NEGATIVE mg/dL
Hgb urine dipstick: NEGATIVE
Ketones, ur: NEGATIVE mg/dL
Nitrite: NEGATIVE
Protein, ur: NEGATIVE mg/dL
Specific Gravity, Urine: 1.014 (ref 1.005–1.030)
Trans Epithel, UA: <1
pH: 7 (ref 5.0–8.0)

## 2017-10-06 LAB — GLUCOSE, CAPILLARY: Glucose-Capillary: 109 mg/dL — ABNORMAL HIGH (ref 65–99)

## 2017-10-06 NOTE — Discharge Instructions (Signed)
Braxton Hicks Contractions °Contractions of the uterus can occur throughout pregnancy, but they are not always a sign that you are in labor. You may have practice contractions called Braxton Hicks contractions. These false labor contractions are sometimes confused with true labor. °What are Braxton Hicks contractions? °Braxton Hicks contractions are tightening movements that occur in the muscles of the uterus before labor. Unlike true labor contractions, these contractions do not result in opening (dilation) and thinning of the cervix. Toward the end of pregnancy (32-34 weeks), Braxton Hicks contractions can happen more often and may become stronger. These contractions are sometimes difficult to tell apart from true labor because they can be very uncomfortable. You should not feel embarrassed if you go to the hospital with false labor. °Sometimes, the only way to tell if you are in true labor is for your health care provider to look for changes in the cervix. The health care provider will do a physical exam and may monitor your contractions. If you are not in true labor, the exam should show that your cervix is not dilating and your water has not broken. °If there are other health problems associated with your pregnancy, it is completely safe for you to be sent home with false labor. You may continue to have Braxton Hicks contractions until you go into true labor. °How to tell the difference between true labor and false labor °True labor °· Contractions last 30-70 seconds. °· Contractions become very regular. °· Discomfort is usually felt in the top of the uterus, and it spreads to the lower abdomen and low back. °· Contractions do not go away with walking. °· Contractions usually become more intense and increase in frequency. °· The cervix dilates and gets thinner. °False labor °· Contractions are usually shorter and not as strong as true labor contractions. °· Contractions are usually irregular. °· Contractions  are often felt in the front of the lower abdomen and in the groin. °· Contractions may go away when you walk around or change positions while lying down. °· Contractions get weaker and are shorter-lasting as time goes on. °· The cervix usually does not dilate or become thin. °Follow these instructions at home: °· Take over-the-counter and prescription medicines only as told by your health care provider. °· Keep up with your usual exercises and follow other instructions from your health care provider. °· Eat and drink lightly if you think you are going into labor. °· If Braxton Hicks contractions are making you uncomfortable: °? Change your position from lying down or resting to walking, or change from walking to resting. °? Sit and rest in a tub of warm water. °? Drink enough fluid to keep your urine pale yellow. Dehydration may cause these contractions. °? Do slow and deep breathing several times an hour. °· Keep all follow-up prenatal visits as told by your health care provider. This is important. °Contact a health care provider if: °· You have a fever. °· You have continuous pain in your abdomen. °Get help right away if: °· Your contractions become stronger, more regular, and closer together. °· You have fluid leaking or gushing from your vagina. °· You pass blood-tinged mucus (bloody show). °· You have bleeding from your vagina. °· You have low back pain that you never had before. °· You feel your baby’s head pushing down and causing pelvic pressure. °· Your baby is not moving inside you as much as it used to. °Summary °· Contractions that occur before labor are called Braxton   Hicks contractions, false labor, or practice contractions. °· Braxton Hicks contractions are usually shorter, weaker, farther apart, and less regular than true labor contractions. True labor contractions usually become progressively stronger and regular and they become more frequent. °· Manage discomfort from Braxton Hicks contractions by  changing position, resting in a warm bath, drinking plenty of water, or practicing deep breathing. °This information is not intended to replace advice given to you by your health care provider. Make sure you discuss any questions you have with your health care provider. °Document Released: 09/08/2016 Document Revised: 09/08/2016 Document Reviewed: 09/08/2016 °Elsevier Interactive Patient Education © 2018 Elsevier Inc. ° °

## 2017-10-06 NOTE — MAU Note (Signed)
I have communicated with Dr. Mindi SlickerBanga and reviewed vital signs:  Vitals:   10/06/17 0304 10/06/17 0359  BP: 119/63 126/70  Pulse: 98 95  Resp: 20 18  Temp: 98.6 F (37 C)     Vaginal exam:  Dilation: 1.5 Effacement (%): 80 Cervical Position: Middle Station: Ballotable Presentation: Vertex Exam by:: B Manmeet Arzola RN,   Also reviewed contraction pattern and that non-stress test is reactive.  It has been documented that patient is contracting every 5-8 minutes with no cervical change since her last appointment not indicating active labor.  Patient denies any other complaints.  Based on this report provider has given order for discharge.  A discharge order and diagnosis entered by Lanice ShirtsV Rogers CNM when Dr. Mindi SlickerBanga unable to log into Epic from home. Labor discharge instructions reviewed with patient.         \

## 2017-10-06 NOTE — MAU Note (Signed)
Pt reports uc's and back pain since 0130. +FM, Denies LOF or VB. Diet COntrolled GDM - last sugar @1300  was 148

## 2017-10-06 NOTE — MAU Note (Signed)
STARTED FEELING UC - STRONG  AT 0130.   VE IN OFFICE  LAST WEEK - 1 CM.  DENIES HSV AND MRSA.  GBS- UNSURE.   NAUSEATED YESTERDAY-   THEN VOMITED STARTED  AT 0130 X2

## 2017-10-16 ENCOUNTER — Telehealth (HOSPITAL_COMMUNITY): Payer: Self-pay | Admitting: *Deleted

## 2017-10-16 ENCOUNTER — Encounter (HOSPITAL_COMMUNITY): Payer: Self-pay | Admitting: *Deleted

## 2017-10-16 NOTE — Telephone Encounter (Signed)
Preadmission screen  

## 2017-10-23 ENCOUNTER — Inpatient Hospital Stay (HOSPITAL_COMMUNITY): Payer: Medicaid Other | Admitting: Anesthesiology

## 2017-10-23 ENCOUNTER — Inpatient Hospital Stay (HOSPITAL_COMMUNITY)
Admission: RE | Admit: 2017-10-23 | Discharge: 2017-10-25 | DRG: 807 | Disposition: A | Discharge status: Home / Self Care | Payer: Medicaid Other | Attending: Obstetrics and Gynecology | Admitting: Obstetrics and Gynecology

## 2017-10-23 ENCOUNTER — Encounter (HOSPITAL_COMMUNITY): Payer: Self-pay

## 2017-10-23 ENCOUNTER — Other Ambulatory Visit: Payer: Self-pay

## 2017-10-23 DIAGNOSIS — O24425 Gestational diabetes mellitus in childbirth, controlled by oral hypoglycemic drugs: Principal | ICD-10-CM | POA: Diagnosis present

## 2017-10-23 DIAGNOSIS — O9902 Anemia complicating childbirth: Secondary | ICD-10-CM | POA: Diagnosis present

## 2017-10-23 DIAGNOSIS — O99214 Obesity complicating childbirth: Secondary | ICD-10-CM | POA: Diagnosis present

## 2017-10-23 DIAGNOSIS — D649 Anemia, unspecified: Secondary | ICD-10-CM | POA: Diagnosis present

## 2017-10-23 DIAGNOSIS — Z3A39 39 weeks gestation of pregnancy: Secondary | ICD-10-CM

## 2017-10-23 DIAGNOSIS — Z9889 Other specified postprocedural states: Secondary | ICD-10-CM | POA: Diagnosis present

## 2017-10-23 LAB — GLUCOSE, CAPILLARY
GLUCOSE-CAPILLARY: 72 mg/dL (ref 65–99)
Glucose-Capillary: 86 mg/dL (ref 65–99)
Glucose-Capillary: 75 mg/dL (ref 65–99)
Glucose-Capillary: 79 mg/dL (ref 65–99)

## 2017-10-23 LAB — TYPE AND SCREEN
ABO/RH(D): A POS
Antibody Screen: NEGATIVE

## 2017-10-23 LAB — CBC
HCT: 31.7 % — ABNORMAL LOW (ref 36.0–46.0)
Hemoglobin: 10.5 g/dL — ABNORMAL LOW (ref 12.0–15.0)
MCH: 26.1 pg (ref 26.0–34.0)
MCHC: 33.1 g/dL (ref 30.0–36.0)
MCV: 78.9 fL (ref 78.0–100.0)
PLATELETS: 314 10*3/uL (ref 150–400)
RBC: 4.02 MIL/uL (ref 3.87–5.11)
RDW: 14.9 % (ref 11.5–15.5)
WBC: 12.4 10*3/uL — ABNORMAL HIGH (ref 4.0–10.5)

## 2017-10-23 LAB — RPR: RPR: NONREACTIVE

## 2017-10-23 MED ORDER — EPHEDRINE 5 MG/ML INJ
10.0000 mg | INTRAVENOUS | Status: DC | PRN
  Filled 2017-10-23: qty 2

## 2017-10-23 MED ORDER — OXYCODONE-ACETAMINOPHEN 5-325 MG PO TABS: 1.0000 | ORAL_TABLET | ORAL | Status: DC | PRN

## 2017-10-23 MED ORDER — LACTATED RINGERS IV SOLN: 500.0000 mL | Freq: Once | INTRAVENOUS | Status: DC

## 2017-10-23 MED ORDER — OXYTOCIN BOLUS FROM INFUSION
500.0000 mL | Freq: Once | INTRAVENOUS | Status: AC
  Administered 2017-10-23: 500 mL via INTRAVENOUS

## 2017-10-23 MED ORDER — LIDOCAINE HCL (PF) 1 % IJ SOLN
INTRAMUSCULAR | Status: DC | PRN
  Administered 2017-10-23: 3 mL via EPIDURAL
  Administered 2017-10-23: 5 mL via EPIDURAL
  Administered 2017-10-23: 2 mL via EPIDURAL

## 2017-10-23 MED ORDER — FLEET ENEMA 7-19 GM/118ML RE ENEM: 1.0000 | ENEMA | RECTAL | Status: DC | PRN

## 2017-10-23 MED ORDER — PHENYLEPHRINE 40 MCG/ML (10ML) SYRINGE FOR IV PUSH (FOR BLOOD PRESSURE SUPPORT)
80.0000 ug | PREFILLED_SYRINGE | INTRAVENOUS | Status: DC | PRN
  Filled 2017-10-23: qty 10
  Filled 2017-10-23: qty 5

## 2017-10-23 MED ORDER — LIDOCAINE HCL (PF) 1 % IJ SOLN
30.0000 mL | INTRAMUSCULAR | Status: DC | PRN
  Administered 2017-10-23: 30 mL via SUBCUTANEOUS
  Filled 2017-10-23: qty 30

## 2017-10-23 MED ORDER — SOD CITRATE-CITRIC ACID 500-334 MG/5ML PO SOLN: 30.0000 mL | ORAL | Status: DC | PRN

## 2017-10-23 MED ORDER — ACETAMINOPHEN 325 MG PO TABS
650.0000 mg | ORAL_TABLET | ORAL | Status: DC | PRN
  Administered 2017-10-23: 650 mg via ORAL
  Filled 2017-10-23: qty 2

## 2017-10-23 MED ORDER — OXYTOCIN 40 UNITS IN LACTATED RINGERS INFUSION - SIMPLE MED
1.0000 m[IU]/min | INTRAVENOUS | Status: DC
  Administered 2017-10-23: 2 m[IU]/min via INTRAVENOUS
  Filled 2017-10-23: qty 1000

## 2017-10-23 MED ORDER — OXYTOCIN 40 UNITS IN LACTATED RINGERS INFUSION - SIMPLE MED: 2.5000 [IU]/h | INTRAVENOUS | Status: DC

## 2017-10-23 MED ORDER — OXYCODONE-ACETAMINOPHEN 5-325 MG PO TABS: 2.0000 | ORAL_TABLET | ORAL | Status: DC | PRN

## 2017-10-23 MED ORDER — LACTATED RINGERS IV SOLN: 500.0000 mL | INTRAVENOUS | Status: DC | PRN

## 2017-10-23 MED ORDER — TERBUTALINE SULFATE 1 MG/ML IJ SOLN
0.2500 mg | Freq: Once | INTRAMUSCULAR | Status: DC | PRN
  Filled 2017-10-23: qty 1

## 2017-10-23 MED ORDER — BUTORPHANOL TARTRATE 1 MG/ML IJ SOLN
1.0000 mg | INTRAMUSCULAR | Status: DC | PRN
  Administered 2017-10-23 (×2): 1 mg via INTRAVENOUS
  Filled 2017-10-23 (×2): qty 1

## 2017-10-23 MED ORDER — LACTATED RINGERS IV SOLN
INTRAVENOUS | Status: DC
  Administered 2017-10-23: 08:00:00 via INTRAVENOUS

## 2017-10-23 MED ORDER — PHENYLEPHRINE 40 MCG/ML (10ML) SYRINGE FOR IV PUSH (FOR BLOOD PRESSURE SUPPORT)
80.0000 ug | PREFILLED_SYRINGE | INTRAVENOUS | Status: DC | PRN
  Filled 2017-10-23: qty 5

## 2017-10-23 MED ORDER — FENTANYL 2.5 MCG/ML BUPIVACAINE 1/10 % EPIDURAL INFUSION (WH - ANES)
14.0000 mL/h | INTRAMUSCULAR | Status: DC | PRN
  Administered 2017-10-23: 14 mL/h via EPIDURAL
  Filled 2017-10-23: qty 100

## 2017-10-23 MED ORDER — DIPHENHYDRAMINE HCL 50 MG/ML IJ SOLN: 12.5000 mg | INTRAMUSCULAR | Status: DC | PRN

## 2017-10-23 MED ORDER — ONDANSETRON HCL 4 MG/2ML IJ SOLN: 4.0000 mg | Freq: Four times a day (QID) | INTRAMUSCULAR | Status: DC | PRN

## 2017-10-23 NOTE — Progress Notes (Signed)
Patient ID: Caitlin Good, female   DOB: 12/07/1995, 22 y.o.   MRN: [id number] Pt reports pain 8/10 with contractions. She tried nitrous with no relief, stadol x 2. +Fms VSS CAT 1 Contractions q 1-322mins 9/100/0  A./P: Pt progressing in labor on 8 of pitocin          Advised trying different positions: bouncing on ball, rocking etc - agrees to ball           Pt considering epidural

## 2017-10-23 NOTE — Anesthesia Pain Management Evaluation Note (Signed)
  [facility] Pain Management Visit Note  Patient: Caitlin Good, 22 y.o., female  "Hello I am a member of the anesthesia team at [facility]. We have an anesthesia team available at all times to provide care throughout the hospital, including epidural management and anesthesia for C-section. I don't know your plan for the delivery whether it a natural birth, water birth, IV sedation, nitrous supplementation, doula or epidural, but we want to meet your pain goals."   1.Was your pain managed to your expectations on prior hospitalizations?   No prior hospitalizations  2.What is your expectation for pain management during this hospitalization?     Epidural  3.How can we help you reach that goal? unsure  Record the patient's initial score and the patient's pain goal.   Pain: 1  Pain Goal: 7 [facility] wants you to be able to say your pain was always managed very well.  Cephus ShellingBURGER,Altovise Wahler 10/23/2017

## 2017-10-23 NOTE — Anesthesia Preprocedure Evaluation (Signed)
Anesthesia Evaluation  Patient identified by MRN, date of birth, ID band Patient awake    Reviewed: Allergy & Precautions, NPO status , Patient's Chart, lab work & pertinent test results  Airway Mallampati: III  TM Distance: >3 FB Neck ROM: Full    Dental  (+) Teeth Intact, Dental Advisory Given   Pulmonary neg pulmonary ROS,    Pulmonary exam normal breath sounds clear to auscultation       Cardiovascular negative cardio ROS Normal cardiovascular exam Rhythm:Regular Rate:Normal     Neuro/Psych PSYCHIATRIC DISORDERS Depression negative neurological ROS     GI/Hepatic negative GI ROS, Neg liver ROS,   Endo/Other  diabetes, Poorly Controlled, Gestational, Oral Hypoglycemic AgentsMorbid obesity  Renal/GU negative Renal ROS     Musculoskeletal negative musculoskeletal ROS (+)   Abdominal   Peds  Hematology  (+) Blood dyscrasia, anemia , Plt 314k   Anesthesia Other Findings Day of surgery medications reviewed with the patient.  Reproductive/Obstetrics (+) Pregnancy                             Anesthesia Physical Anesthesia Plan  ASA: III  Anesthesia Plan: Epidural   Post-op Pain Management:    Induction:   PONV Risk Score and Plan: 2 and Treatment may vary due to age or medical condition  Airway Management Planned:   Additional Equipment:   Intra-op Plan:   Post-operative Plan:   Informed Consent: I have reviewed the patients History and Physical, chart, labs and discussed the procedure including the risks, benefits and alternatives for the proposed anesthesia with the patient or authorized representative who has indicated his/her understanding and acceptance.   Dental advisory given  Plan Discussed with:   Anesthesia Plan Comments: (Patient identified. Risks/Benefits/Options discussed with patient including but not limited to bleeding, infection, nerve damage, paralysis,  failed block, incomplete pain control, headache, blood pressure changes, nausea, vomiting, reactions to medication both or allergic, itching and postpartum back pain. Confirmed with bedside nurse the patient's most recent platelet count. Confirmed with patient that they are not currently taking any anticoagulation, have any bleeding history or any family history of bleeding disorders. Patient expressed understanding and wished to proceed. All questions were answered. )        Anesthesia Quick Evaluation

## 2017-10-23 NOTE — Progress Notes (Signed)
Caitlin Good is a 22 y.o. G2P0010 at 6216w3d by ultrasound admitted for induction of labor due to Gestational diabetes.  Subjective:   Objective: BP (!) 138/53   Pulse 71   Temp 98.2 F (36.8 C) (Oral)   Resp 17   Ht 5\' 3"  (1.6 m)   Wt 246 lb 9.6 oz (111.9 kg)   LMP 12/15/2016   BMI 43.68 kg/m  No intake/output data recorded. No intake/output data recorded.  FHT:  FHR: 140 bpm, variability: moderate,  accelerations:  Present,  decelerations:  Absent UC:   regular, every 1--3 minutes SVE:   Dilation: 5 Effacement (%): 70 Station: -2 Exam by:: Gifford ShaveYancey Luft RN   Labs: Lab Results  Component Value Date   WBC 12.4 (H) 10/23/2017   HGB 10.5 (L) 10/23/2017   HCT 31.7 (L) 10/23/2017   MCV 78.9 10/23/2017   PLT 314 10/23/2017    Assessment / Plan: Induction of labor due to gestational diabetes,  progressing well on pitocin  Labor: Progressing normally Preeclampsia:  increasing BP but ? due to pain vs preE; just received stadol; if BP still elevated in 15 mins will check preE labs Fetal Wellbeing:  Category I Pain Control:  IV pain meds I/D:  n/a Anticipated MOD:  NSVD  Janean SarkCecilia W Banga 10/23/2017, 12:32 PM

## 2017-10-23 NOTE — H&P (Addendum)
Caitlin Good is a 22 y.o.G2P0010  female presenting at 2039 3/[redacted]wks gestation for term pregnancy with GDM, favorable cervix; elective iol. She is dated per a 7 week US. Her prenatal care was complicated by a gestational diabetes - started on metformin in past month, maternal obesity and hx of depression but no meds needed in pregnancy. She had neg essential panel and first trimester screening. GBS is negative. Favorable bishops score noted.  OB History    Gravida  2   Para      Term      Preterm      AB  1   Living        SAB  1   TAB      Ectopic      Multiple      Live Births  0          Past Medical History:  Diagnosis Date  . Depression    No recent meds (used Lexapro)   Past Surgical History:  Procedure Laterality Date  . ADENOIDECTOMY    . TONSILLECTOMY     Family History: family history includes Diabetes in her mother; Hypertension in her mother; Stroke in her mother. Social History:  reports that she has never smoked. She has never used smokeless tobacco. She reports that she does not drink alcohol or use drugs.     Maternal Diabetes: Yes:  Diabetes Type:  Insulin/Medication controlled Genetic Screening: Normal Maternal Ultrasounds/Referrals: Normal Fetal Ultrasounds or other Referrals:  None Maternal Substance Abuse:  No Significant Maternal Medications:  Meds include: Other: metformin Significant Maternal Lab Results:  Lab values include: Group B Strep negative Other Comments:  None  Review of Systems  Constitutional: Negative for chills, fever, malaise/fatigue and weight loss.  Eyes: Negative for blurred vision and double vision.  Respiratory: Positive for shortness of breath.   Cardiovascular: Positive for leg swelling. Negative for chest pain and palpitations.  Gastrointestinal: Positive for abdominal pain. Negative for heartburn, nausea and vomiting.  Genitourinary: Negative for dysuria.  Musculoskeletal: Positive for back pain. Negative for  myalgias.  Skin: Negative for itching and rash.  Neurological: Negative for dizziness and headaches.  Endo/Heme/Allergies: Does not bruise/bleed easily.  Psychiatric/Behavioral: Negative for depression, hallucinations, substance abuse and suicidal ideas. The patient is nervous/anxious.    Maternal Medical History:  Reason for admission: Nausea. Term pregnancy, GDMA1, favorable cervix; elective iol  Contractions: Onset was 1-2 hours ago.   Perceived severity is mild.    Fetal activity: Perceived fetal activity is normal.   Last perceived fetal movement was within the past hour.    Prenatal Complications - Diabetes: gestational. Diabetes is managed by oral agent (monotherapy).      Dilation: 3 Effacement (%): 70 Station: -2 Exam by:: Dr. Mindi SlickerBanga Blood pressure 132/79, pulse 77, temperature 98.1 F (36.7 C), temperature source Oral, resp. rate 19, height 5\' 3"  (1.6 m), weight 246 lb 9.6 oz (111.9 kg), last menstrual period 12/15/2016, unknown if currently breastfeeding. Maternal Exam:  Uterine Assessment: Contraction strength is mild.  Contraction frequency is regular.   Abdomen: Patient reports generalized tenderness.  Estimated fetal weight is AGA.   Fetal presentation: vertex  Introitus: Normal vulva. Vulva is negative for condylomata and lesion.  Vagina is negative for condylomata and edema.  Pelvis: adequate for delivery.   Cervix: Cervix evaluated by digital exam.     Fetal Exam Fetal Monitor Review: Baseline rate: 140.  Variability: moderate (6-25 bpm).   Pattern: accelerations present and no  decelerations.    Fetal State Assessment: Category I - tracings are normal.     Physical Exam  Constitutional: She is oriented to person, place, and time. She appears well-developed and well-nourished.  Neck: Normal range of motion.  Cardiovascular: Normal rate.  Respiratory: Effort normal.  GI: Soft. There is generalized tenderness.  Genitourinary: Vagina normal and  uterus normal. Vulva exhibits no lesion.  Musculoskeletal: Normal range of motion. She exhibits edema.  Neurological: She is alert and oriented to person, place, and time.  Skin: Skin is warm.  Psychiatric: She has a normal mood and affect. Her behavior is normal. Judgment and thought content normal.    Prenatal labs: ABO, Rh: --/--/A POS (06/17 1610) Antibody: PENDING (06/17 9604) Rubella: Immune (11/16 0000) RPR: Nonreactive (11/16 0000)  HBsAg: Negative (11/16 0000)  HIV: Non-reactive (11/16 0000)  GBS: Negative (05/23 0000)   Assessment/Plan: G2P0010 at 39 3/7wks with GDMA2 for iol: FSBS q 4 Pitocin per protocol Cat 1 strip AROM performed with clear fluid noted Pain control prn Anticipate svd GBS neg   [name] 10/23/2017, 9:15 AM

## 2017-10-23 NOTE — Anesthesia Procedure Notes (Signed)
Epidural Patient location during procedure: OB Start time: 10/23/2017 6:03 PM End time: 10/23/2017 6:10 PM  Staffing Anesthesiologist: Cecile Hearingurk, Stephen Edward, MD Performed: anesthesiologist   Preanesthetic Checklist Completed: patient identified, pre-op evaluation, timeout performed, IV checked, risks and benefits discussed and monitors and equipment checked  Epidural Patient position: sitting Prep: DuraPrep Patient monitoring: blood pressure and continuous pulse ox Approach: midline Location: L3-L4 Injection technique: LOR air  Needle:  Needle type: Tuohy  Needle gauge: 17 G Needle length: 9 cm Needle insertion depth: 7 cm Catheter size: 19 Gauge Catheter at skin depth: 12 cm Test dose: negative and Other (1% Lidocaine)  Additional Notes Patient identified.  Risk benefits discussed including failed block, incomplete pain control, headache, nerve damage, paralysis, blood pressure changes, nausea, vomiting, reactions to medication both toxic or allergic, and postpartum back pain.  Patient expressed understanding and wished to proceed.  All questions were answered.  Sterile technique used throughout procedure and epidural site dressed with sterile barrier dressing. No paresthesia or other complications noted. The patient did not experience any signs of intravascular injection such as tinnitus or metallic taste in mouth nor signs of intrathecal spread such as rapid motor block. Please see nursing notes for vital signs. Reason for block:procedure for pain

## 2017-10-24 LAB — CBC
HEMATOCRIT: 27.2 % — AB (ref 36.0–46.0)
Hemoglobin: 9.1 g/dL — ABNORMAL LOW (ref 12.0–15.0)
MCH: 26.4 pg (ref 26.0–34.0)
MCHC: 33.5 g/dL (ref 30.0–36.0)
MCV: 78.8 fL (ref 78.0–100.0)
Platelets: 329 10*3/uL (ref 150–400)
RBC: 3.45 MIL/uL — AB (ref 3.87–5.11)
RDW: 15.3 % (ref 11.5–15.5)
WBC: 17.1 10*3/uL — AB (ref 4.0–10.5)

## 2017-10-24 MED ORDER — TETANUS-DIPHTH-ACELL PERTUSSIS 5-2.5-18.5 LF-MCG/0.5 IM SUSP: 0.5000 mL | Freq: Once | INTRAMUSCULAR | Status: DC

## 2017-10-24 MED ORDER — IBUPROFEN 600 MG PO TABS
600.0000 mg | ORAL_TABLET | Freq: Four times a day (QID) | ORAL | Status: DC
  Administered 2017-10-24 – 2017-10-25 (×6): 600 mg via ORAL
  Filled 2017-10-24 (×6): qty 1

## 2017-10-24 MED ORDER — DIPHENHYDRAMINE HCL 25 MG PO CAPS: 25.0000 mg | ORAL_CAPSULE | Freq: Four times a day (QID) | ORAL | Status: DC | PRN

## 2017-10-24 MED ORDER — WITCH HAZEL-GLYCERIN EX PADS: 1.0000 "application " | MEDICATED_PAD | CUTANEOUS | Status: DC | PRN

## 2017-10-24 MED ORDER — PRENATAL MULTIVITAMIN CH
1.0000 | ORAL_TABLET | Freq: Every day | ORAL | Status: DC
  Administered 2017-10-24 – 2017-10-25 (×2): 1 via ORAL
  Filled 2017-10-24 (×2): qty 1

## 2017-10-24 MED ORDER — SENNOSIDES-DOCUSATE SODIUM 8.6-50 MG PO TABS
2.0000 | ORAL_TABLET | ORAL | Status: DC
  Administered 2017-10-24: 2 via ORAL
  Filled 2017-10-24: qty 2

## 2017-10-24 MED ORDER — ONDANSETRON HCL 4 MG PO TABS: 4.0000 mg | ORAL_TABLET | ORAL | Status: DC | PRN

## 2017-10-24 MED ORDER — DIBUCAINE 1 % RE OINT: 1.0000 "application " | TOPICAL_OINTMENT | RECTAL | Status: DC | PRN

## 2017-10-24 MED ORDER — OXYCODONE HCL 5 MG PO TABS
5.0000 mg | ORAL_TABLET | ORAL | Status: DC | PRN
  Administered 2017-10-24: 5 mg via ORAL
  Filled 2017-10-24: qty 1

## 2017-10-24 MED ORDER — COCONUT OIL OIL: 1.0000 "application " | TOPICAL_OIL | Status: DC | PRN

## 2017-10-24 MED ORDER — ONDANSETRON HCL 4 MG/2ML IJ SOLN: 4.0000 mg | INTRAMUSCULAR | Status: DC | PRN

## 2017-10-24 MED ORDER — SIMETHICONE 80 MG PO CHEW: 80.0000 mg | CHEWABLE_TABLET | ORAL | Status: DC | PRN

## 2017-10-24 MED ORDER — OXYCODONE HCL 5 MG PO TABS: 10.0000 mg | ORAL_TABLET | ORAL | Status: DC | PRN

## 2017-10-24 MED ORDER — BENZOCAINE-MENTHOL 20-0.5 % EX AERO
1.0000 "application " | INHALATION_SPRAY | CUTANEOUS | Status: DC | PRN
  Administered 2017-10-24: 1 via TOPICAL
  Filled 2017-10-24: qty 56

## 2017-10-24 MED ORDER — ZOLPIDEM TARTRATE 5 MG PO TABS: 5.0000 mg | ORAL_TABLET | Freq: Every evening | ORAL | Status: DC | PRN

## 2017-10-24 MED ORDER — ACETAMINOPHEN 325 MG PO TABS
650.0000 mg | ORAL_TABLET | ORAL | Status: DC | PRN
  Administered 2017-10-24: 650 mg via ORAL
  Filled 2017-10-24: qty 2

## 2017-10-24 NOTE — Lactation Note (Addendum)
This note was copied from a baby's chart. Lactation Consultation Note  Patient Name: Caitlin Good Today's Date: 10/24/2017 Reason for consult: Initial assessment;Primapara;1st time breastfeeding;Term  P1 mother whose infant is now 378 hours old.    Infant is crying and fussy as I entered the room.  RN working with mother and assisting her to the bathroom.  I helped baby calm and allowed her to suck on my gloved finger.  Her suck initially was disorganized but with help she began sucking in strong bursts.  Offered to assist with latch and mother accepted.  Mother has larger, tubular shaped, wider spaced  breasts with short shafted nipples bilaterally.  Encouraged mother to watch and wait for a wide open mouth before latching.  Once latched, infant began sucking with gentle stimulation.  She became restless and I had to relatch multiple times for her to suck. Taught mother to do breast compressions during feeding.  Reviewed feeding baby 8-12 times/24 hours or earlier if she shows feeding cues.  Reviewed feeding cues.  Continue STS, breast massage and hand expression after feedings.  Mother had some questions and I answered them to her satisfaction.  I encouraged her to ask for latch assistance as needed  Breast shells and manual pump provided with instructions for use.  Cleaning basin set up and discussed with parents. Encouraged mother to wear breast shells between feedings but not at night and to pre-pump with manual pump to help evert nipples.  Mother verbalized understanding   Mom made aware of O/P services, breastfeeding support groups, community resources, and our phone # for post-discharge questions. Mother will call for assistance as needed. Father present and supportive.   Maternal Data Formula Feeding for Exclusion: No Has patient been taught Hand Expression?: Yes Does the patient have breastfeeding experience prior to this delivery?: No  Feeding Feeding Type: Breast Fed Length of  feed: 15 min(still feeding)  LATCH Score Latch: Grasps breast easily, tongue down, lips flanged, rhythmical sucking.  Audible Swallowing: None  Type of Nipple: Everted at rest and after stimulation(short shafted)  Comfort (Breast/Nipple): Soft / non-tender  Hold (Positioning): Assistance needed to correctly position infant at breast and maintain latch.  LATCH Score: 7  Interventions Interventions: Breast feeding basics reviewed;Assisted with latch;Skin to skin;Breast massage;Hand express;Position options;Support pillows;Adjust position;Breast compression;Shells;Hand pump  Lactation Tools Discussed/Used Tools: Shells;Pump Shell Type: Inverted Breast pump type: Manual   Consult Status Consult Status: Follow-up Date: 10/25/17 Follow-up type: In-patient    Dora SimsBeth R Emmersen Garraway 10/24/2017, 6:36 AM

## 2017-10-24 NOTE — Progress Notes (Signed)
Post Partum Day 1 Subjective: no complaints, up ad lib and tolerating PO  Objective: Blood pressure 134/69, pulse (!) 102, temperature 99 F (37.2 C), temperature source Oral, resp. rate 18, height 5\' 3"  (1.6 m), weight 111.9 kg (246 lb 9.6 oz), last menstrual period 12/15/2016, SpO2 97 %, unknown if currently breastfeeding.  Physical Exam:  General: alert and cooperative Lochia: appropriate Uterine Fundus: firm Incision: C/D/I   Recent Labs    10/23/17 0714 10/24/17 0431  HGB 10.5* 9.1*  HCT 31.7* 27.2*    Assessment/Plan: Plan for discharge tomorrow  Breastfeeding All CBG's WNL so will d/c   LOS: 1 day   Oliver PilaKathy W Mersades Barbaro 10/24/2017, 9:31 AM

## 2017-10-24 NOTE — Progress Notes (Signed)
MOB was referred for history of depression/anxiety. * Referral screened out by Clinical Social Worker because none of the following criteria appear to apply: ~ History of anxiety/depression during this pregnancy, or of post-partum depression; No concerns noted in OB record.  ~ Diagnosis of anxiety and/or depression within last 3 years OR * MOB's symptoms currently being treated with medication and/or therapy.  Please contact the Clinical Social Worker if needs arise, by MOB request, or if MOB scores greater than 9/yes to question 10 on Edinburgh Postpartum Depression Screen.  Phil Corti Boyd-Gilyard, MSW, LCSW Clinical Social Work [phone] 

## 2017-10-24 NOTE — Anesthesia Postprocedure Evaluation (Signed)
Anesthesia Post Note  Patient: Caitlin Good  Procedure(s) Performed: AN AD HOC LABOR EPIDURAL     Patient location during evaluation: Mother Baby Anesthesia Type: Epidural Level of consciousness: awake and alert and oriented Pain management: satisfactory to patient Vital Signs Assessment: post-procedure vital signs reviewed and stable Respiratory status: respiratory function stable Cardiovascular status: stable Postop Assessment: no headache, no backache, epidural receding, patient able to bend at knees, no signs of nausea or vomiting and adequate PO intake Anesthetic complications: no    Last Vitals:  Vitals:   10/24/17 0143 10/24/17 0547  BP: 133/65 134/69  Pulse: (!) 123 (!) 102  Resp: 18 18  Temp: 37.4 C 37.2 C  SpO2: 97%     Last Pain:  Vitals:   10/24/17 0547  TempSrc: Oral  PainSc: 6    Pain Goal:                 Lasean Gorniak

## 2017-10-25 MED ORDER — IBUPROFEN 600 MG PO TABS: 600.0000 mg | ORAL_TABLET | Freq: Four times a day (QID) | ORAL | 0 refills | Status: DC

## 2017-10-25 NOTE — Lactation Note (Signed)
This note was copied from a baby's chart. Lactation Consultation Note  Patient Name: Girl Lelon Frohlichdriana Engebretson AVWUJ'WToday's Date: 10/25/2017 Reason for consult: Follow-up assessment;Difficult latch Assisted with positioning baby in football hold.  Baby continues to be very sleepy and not showing signs of hunger.  Attempted with and without nipple shield for several minutes but baby not opening mouth and no latch achieved.  Mom recently pumped a few mls of colostrum which was cup fed to baby.  Mom finished feeding with Similac 19 calories per bottle.  Baby doesn't have a good seal on bottle and milk dribbling out the side of her mouth.  Mom will continue to try breast first, pump every 2-3 hours and supplement with expressed milk/formula with slow flow nipple.  Maternal Data    Feeding Feeding Type: Breast Fed Nipple Type: Slow - flow  LATCH Score Latch: Too sleepy or reluctant, no latch achieved, no sucking elicited.  Audible Swallowing: None  Type of Nipple: Everted at rest and after stimulation  Comfort (Breast/Nipple): Soft / non-tender  Hold (Positioning): Assistance needed to correctly position infant at breast and maintain latch.  LATCH Score: 5  Interventions Interventions: Assisted with latch;Breast compression;Skin to skin;Adjust position;Breast massage;Support pillows;Hand express  Lactation Tools Discussed/Used Tools: Nipple Shields Nipple shield size: 20   Consult Status Consult Status: Follow-up Date: 10/26/17 Follow-up type: In-patient    Huston FoleyMOULDEN, Jalaine Riggenbach S 10/25/2017, 3:12 PM

## 2017-10-25 NOTE — Lactation Note (Signed)
This note was copied from a baby'Good chart. Lactation Consultation Note  Patient Name: Caitlin Good ZOXWR'UToday'Good Date: 10/25/2017 Reason for consult: Follow-up assessment;Difficult latch Assisted with positioning baby in football hold.  Baby showing no interest in feeding and not opening mouth.  She will suck on gloved finger but keeps her tongue pulled back in mouth.  Waking techniques done but baby remains sleepy.  20 mm nipple shield applied but no suck elicited.  Mom fed baby 8 mls of colostrum per bottle.  Instructed to pump every 2-3 hours.  I will follow up this afternoon for another feeding assist.  Maternal Data    Feeding Feeding Type: Breast Fed Length of feed: 20 min  LATCH Score Latch: Too sleepy or reluctant, no latch achieved, no sucking elicited.  Audible Swallowing: None  Type of Nipple: Everted at rest and after stimulation  Comfort (Breast/Nipple): Soft / non-tender  Hold (Positioning): Assistance needed to correctly position infant at breast and maintain latch.  LATCH Score: 5  Interventions Interventions: Assisted with latch;Breast compression;Skin to skin;Adjust position;Breast massage;Support pillows;Hand express  Lactation Tools Discussed/Used Tools: 22F feeding tube / Syringe WIC Program: No Pump Review: Setup, frequency, and cleaning Initiated by:: LM Date initiated:: 10/25/17   Consult Status Consult Status: Follow-up Date: 10/25/17 Follow-up type: In-patient    Huston FoleyMOULDEN, Caitlin Good 10/25/2017, 11:57 AM

## 2017-10-25 NOTE — Progress Notes (Signed)
Mother reports baby is falling asleep at the breast after a few sucks. She reports baby does not maintain latch. Colostrum is noted around baby's mouth and mother can hand express a small amount of colostrum prior to latch. Infant has a strong suck however biting motion without tongue extension over gum noted with suck assessment with gloved finger.   Syringe and #5 [place] feeding tube option as a supplemental feeding system discussed with mother and she was receptive to use. Reviewed safety, cleaning  and goal to get baby actively feeding at the breast to stimulate milk supply. Infant stayed engaged with the feeding while obtaining formula while breastfeeding. She was able to pull milk from tube at times but mostly needed parent to advance milk slowly. Readjustment to the latch occurred several times as baby would slip off breast. Following the feeding, mother was able to easily express colostrum at a greater value.    Mother has been instructed on cup feeding which will aid infant with tongue extension. Reviewed suck training exercises. Mother has a breast pump at home and hand pump. Discussed pumping following the feeding for additional stimulation and to provide supplement for SNS, as needed.   Will update Lactation Consultant and request consult. Discussed the value of an out patient Lactation appointment for continue support and assessment once discharged. Mother is very motivated and FOB is supportive.

## 2017-10-25 NOTE — Lactation Note (Signed)
This note was copied from a baby's chart. Lactation Consultation Note  Patient Name: Girl Lelon Frohlichdriana Headings MWNUU'VToday's Date: 10/25/2017 Reason for consult: Follow-up assessment Baby just finished a feeding using a 5 french feeding tube and syringe.  Baby took 10 mls of formula.  Pecola LeisureBaby is now 7634 hours old and has had mostly attempts at breast and a few cup feeds.  Discussed with mom the importance of breast stimulation for adequate milk supply.  Symphony pump initiated with mom.  She has a Medela DEBP at home.  Instructed to pump every 3 hours for 15-20 minutes.  [facility] when baby ready to eat again.  Maternal Data    Feeding Feeding Type: (P) Breast Fed Length of feed: (P) 20 min  LATCH Score                   Interventions    Lactation Tools Discussed/Used Tools: (P) 54F feeding tube / Syringe Pump Review: Setup, frequency, and cleaning Initiated by:: LM Date initiated:: 10/25/17   Consult Status Consult Status: Follow-up Date: 10/25/17    Huston FoleyMOULDEN, Xavyer Steenson S 10/25/2017, 9:24 AM

## 2017-10-25 NOTE — Discharge Instructions (Signed)
As per discharge pamphlet °

## 2017-10-25 NOTE — Lactation Note (Signed)
This note was copied from a baby's chart. Lactation Consultation Note  Patient Name: Caitlin Lelon Frohlichdriana Point WUJWJ'XToday's Date: 10/25/2017 Reason for consult: Follow-up assessment;Difficult latch;1st time breastfeeding;Primapara;Term  P1 mother whose infant is now 7529 hours old.    Mother continues to need assistance with latch.  Baby frantic and crying as I entered.  Offered to assist with latch and mother accepted.  Baby was so frustrated that she would not calm down to suck on my gloved finger or to latch onto mother's breast.  Mother had formula at bedside and I asked permission to cup feed a small amount to settle baby.  Mother agreed.  Cup fed baby 10 mls of Sim 19 without difficulty.  Baby was very calm and quiet after receiving supplement.  Attempted to latch onto the right breast in the football hold.  Baby would suck 3-4 times with strong rhythmic suck and stop to sleep.  Demonstrated gentle pestering and rubbing of baby to awaken her.  She sucked anotehr 3-4 sucks and stopped.  This was repeated multiple times without success of a good deep continued suck.  Mother did breast compressions during latch and baby still remained sleepy with no effort to maintain a good sucking pattern.  Swaddled baby and placed in bassinet.  She became fussy and frantic in bassinet.  Offered another 10 mls of sim 19 with the cup and she easily finished that amount.  Baby burped and fell asleep quickly.  Encouraged mother to continue monitoring for feeding cues, doing STS, breast massage and hand expression throughout the day.  Baby should become more alert as the day progresses.  Mother will continue to ask for latch assistance as needed.  Father present and sleeping. RN updated.   Maternal Data Formula Feeding for Exclusion: No Has patient been taught Hand Expression?: Yes Does the patient have breastfeeding experience prior to this delivery?: No  Feeding Feeding Type: Breast Fed Length of feed: 15  min(on/off)  LATCH Score Latch: Repeated attempts needed to sustain latch, nipple held in mouth throughout feeding, stimulation needed to elicit sucking reflex.  Audible Swallowing: None  Type of Nipple: Everted at rest and after stimulation(short shafted)  Comfort (Breast/Nipple): Soft / non-tender  Hold (Positioning): Assistance needed to correctly position infant at breast and maintain latch.  LATCH Score: 6  Interventions Interventions: Breast feeding basics reviewed;Assisted with latch;Skin to skin;Breast massage;Position options;Support pillows;Adjust position;Breast compression;Shells;Hand pump  Lactation Tools Discussed/Used Tools: Shells Shell Type: Inverted Breast pump type: Manual   Consult Status Consult Status: Follow-up Date: 10/26/17 Follow-up type: In-patient    [name] 10/25/2017, 5:03 AM

## 2017-10-25 NOTE — Progress Notes (Signed)
PPD #2 Doing well, ready to go home Afeb, VSS Fundus firm D/c home 

## 2017-10-25 NOTE — Discharge Summary (Signed)
OB Discharge Summary     Patient Name: Caitlin Good DOB: 1995/11/03 MRN: [id number]  Date of admission: 10/23/2017 Delivering MD: Pryor OchoaBANGA, CECILIA [facility]   Date of discharge: 10/25/2017  Admitting diagnosis: INDUCTION Intrauterine pregnancy: 614w5d     Secondary diagnosis:  Active Problems:   Status post induction of labor   SVD (spontaneous vaginal delivery)   Postpartum care following vaginal delivery      Discharge diagnosis: Term Pregnancy Delivered and GDM A2                                                                                                Hospital course:  Induction of Labor With Vaginal Delivery   [age] y.o. yo G2P0010 at 8314w5d was admitted to the hospital 10/23/2017 for induction of labor.  Indication for induction: Favorable cervix at term and A2 DM.  Patient had an uncomplicated labor course as follows: Membrane Rupture Time/Date: 8:50 AM ,10/23/2017   Intrapartum Procedures: Episiotomy: None [1]                                         Lacerations:  2nd degree [3];Perineal [11]  Patient had delivery of a Viable infant.  Information for the patient's newborn:  Samuel BoucheSilva, Girl Mahdiya [[id number]][[id number]]  Delivery Method: Vaginal, Spontaneous(Filed from Delivery Summary)   10/23/2017  Details of delivery can be found in separate delivery note.  Patient had a routine postpartum course. Patient is discharged home 10/25/17.  Physical exam  Vitals:   10/24/17 1459 10/24/17 1850 10/24/17 2116 10/25/17 0534  BP: (!) 144/77 (!) 141/71 131/73 119/61  Pulse: 100 98 87 80  Resp: 18 20 16 16   Temp: 98.9 F (37.2 C) 98.2 F (36.8 C) 98.6 F (37 C) 98.3 F (36.8 C)  TempSrc: Oral Oral Oral Oral  SpO2:      Weight:      Height:       General: alert Lochia: appropriate Uterine Fundus: firm  Labs: Lab Results  Component Value Date   WBC 17.1 (H) 10/24/2017   HGB 9.1 (L) 10/24/2017   HCT 27.2 (L) 10/24/2017   MCV 78.8 10/24/2017   PLT 329 10/24/2017   CMP Latest Ref Rng  & Units 03/10/2016  Glucose 65 - 99 mg/dL 413(K104(H)  BUN 6 - 20 mg/dL 18  Creatinine 4.400.44 - 1.021.00 mg/dL 7.250.70  Sodium 366135 - 440145 mmol/L 139  Potassium 3.5 - 5.1 mmol/L 3.7  Chloride 101 - 111 mmol/L 105    Discharge instruction: per After Visit Summary and "Baby and Me Booklet".  After visit meds:  Allergies as of 10/25/2017   No Known Allergies     Medication List    STOP taking these medications   metFORMIN 500 MG tablet Commonly known as:  GLUCOPHAGE     TAKE these medications   ibuprofen 600 MG tablet Commonly known as:  ADVIL,MOTRIN Take 1 tablet (600 mg total) by mouth every 6 (six) hours.   prenatal multivitamin Tabs tablet Take 1  tablet by mouth daily at 12 noon.       Diet: routine diet  Activity: Advance as tolerated. Pelvic rest for 6 weeks.   Outpatient follow up:6 weeks  Newborn Data: Live born female  Birth Weight: 9 lb 3.6 oz (4184 g) APGAR: 6, 8  Newborn Delivery   Birth date/time:  10/23/2017 22:34:00 Delivery type:  Vaginal, Spontaneous     Baby Feeding: Breast Disposition:home with mother   10/25/2017 Zenaida Niece, MD

## 2017-10-26 ENCOUNTER — Ambulatory Visit: Payer: Self-pay

## 2017-10-26 NOTE — Lactation Note (Signed)
This note was copied from a baby's chart. Lactation Consultation Note  Patient Name: Girl Lelon Frohlichdriana Oyervides QIONG'EToday's Date: 10/26/2017 Reason for consult: Follow-up assessment;Difficult latch;Term;1st time breastfeeding;Primapara   Follow up with mom of 60 hour old infant. Infant with 1 BF x 10 minutes, BF attempts, EBM x 5 of 3-20 cc, formula x 5 of 5-20 cc, 5 voids and 2 stools in the last 24 hours. Infant weight 8 pounds 13.5 ounces with 4% weight loss since birth. LATCH score 5.   Infant is not latching well. Mom reports she did latch earlier today for 10 minutes, mom was pleased. Mom is supplementing with EBM and formula after BF attempts. Enc mom to continue supplementing as much as infant wants after offering breast. Mom to use the NS as needed for feedings.   Mom is pumping every 3 hours with DEBP and milk volume is increasing. Enc mom to continue pumping every 3 hours after BF to protect supply. Mom voiced understanding. Mom has a Medela PIS at home for use. Advised mom to take all pump parts and tubing home with her. Enc mom to follow pumping with hand expression, mom reprots she is comfortable with hand expression.   Reviewed I/O, signs of dehydration in the infant , how to tell when infant is getting enough to eat, engorgement prevention/treatment and breast milk expression and storage.   Offered mom OP Lactation appt, she is agreeable and would like the assistance. [facility] to call mom and schedule appt. Mom informed to bring infant hungry, bring tools she using, and some EBM or formula to appt. Mom aware to check in at [facility]   Parents reports all questions/concerns have been answered. Mom to call with any questions/concerns as needed. [facility] reviewed, mom aware of OP services, BF Support Groups and LC phone #. Infant with follow up Ped appt tomorrow.        Maternal Data Formula Feeding for Exclusion: No Has patient  been taught Hand Expression?: Yes Does the patient have breastfeeding experience prior to this delivery?: No  Feeding    LATCH Score                   Interventions Interventions: Breast massage;Breast compression;Hand express;DEBP;Expressed milk;Skin to skin  Lactation Tools Discussed/Used WIC Program: No Pump Review: Setup, frequency, and cleaning;Milk Storage Initiated by:: Reviewed and encouraged 8 x a day post BF   Consult Status Consult Status: Follow-up Follow-up type: Out-patient    Silas FloodSharon S Brynlei Klausner 10/26/2017, 10:53 AM

## 2018-01-19 ENCOUNTER — Encounter (HOSPITAL_COMMUNITY): Payer: Self-pay

## 2018-09-01 IMAGING — US US OB TRANSVAGINAL
1 series · 15 of 28 positions shown · non-contrast
Comparison: 09/21/2016 ultrasound of the pelvis.

CLINICAL DATA: 21 y/o F; abdominal pain. Pregnancy test in process.

EXAM:
OBSTETRIC <14 WK US AND TRANSVAGINAL OB US
TECHNIQUE: Both transabdominal and transvaginal ultrasound examinations were
performed for complete evaluation of the gestation as well as the
maternal uterus, adnexal regions, and pelvic cul-de-sac.
Transvaginal technique was performed to assess early pregnancy.

[Series 1: us ob transvaginal · 15 of 36 slices shown]
[im 1/36]
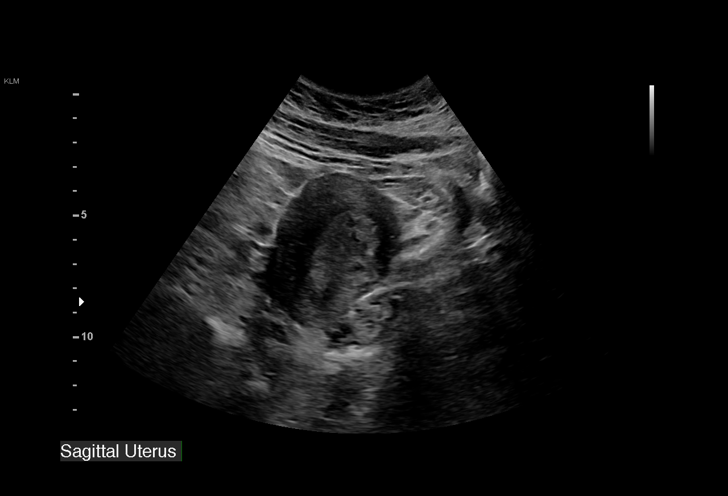
[im 3/36]
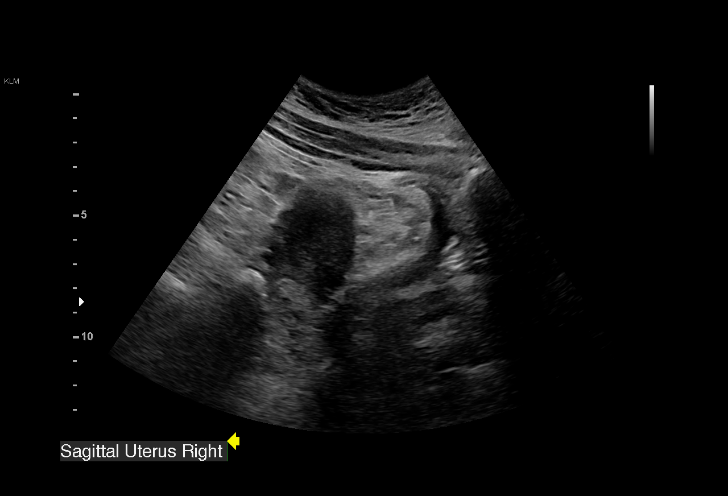
[im 6/36]
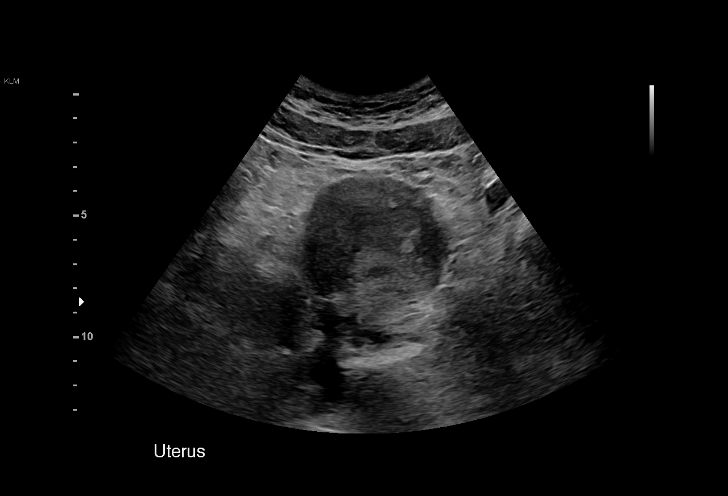
[im 8/36]
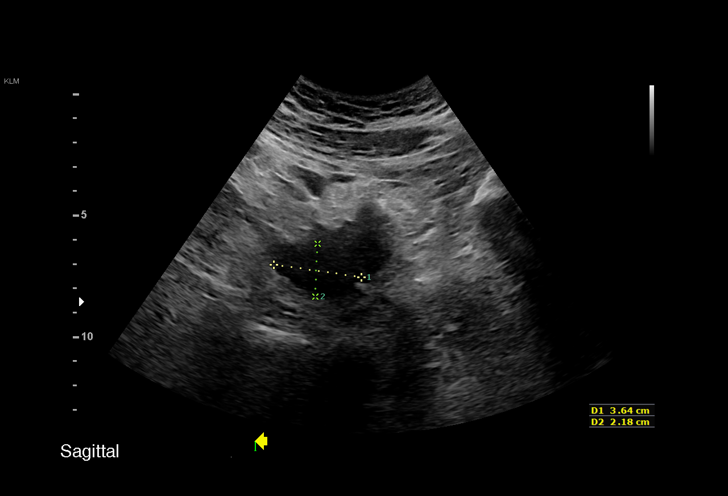
[im 11/36]
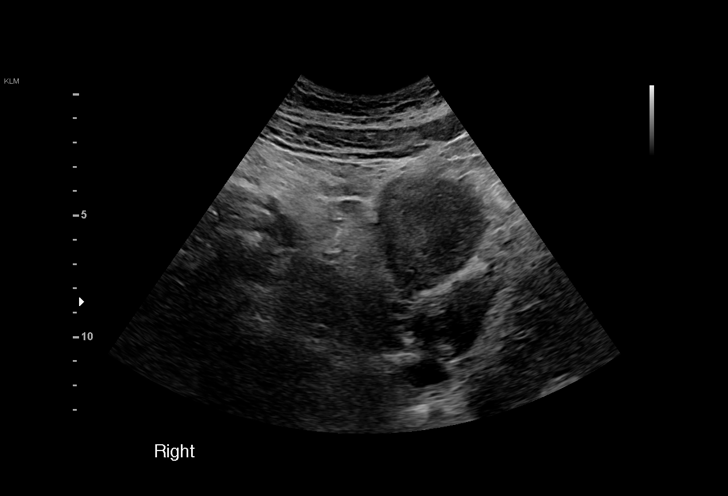
[im 13/36]
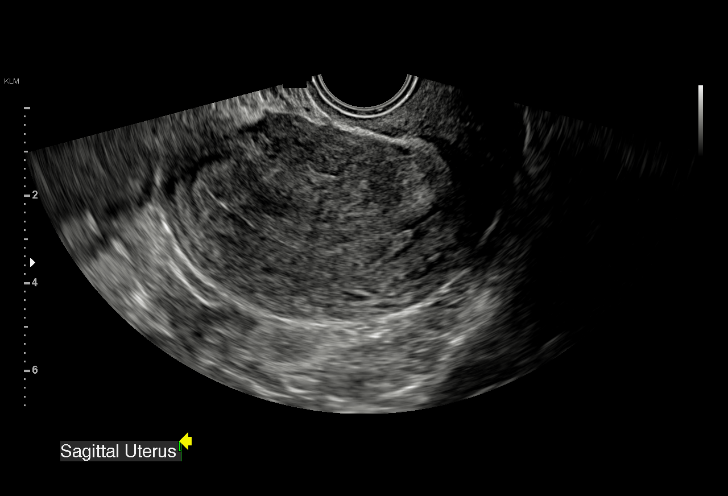
[im 16/36]
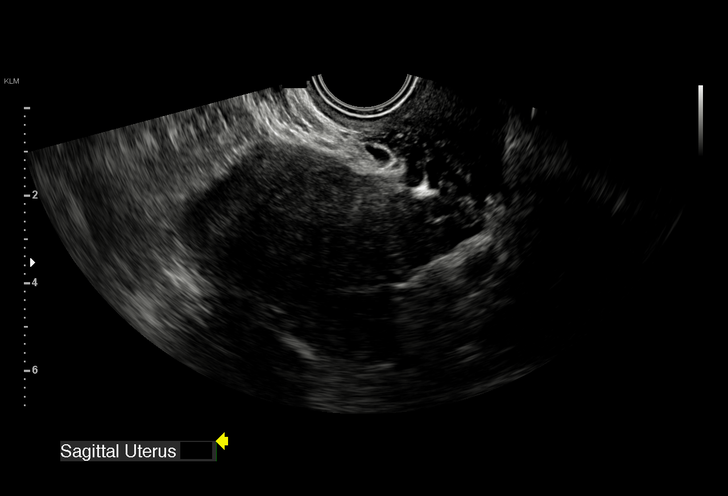
[im 19/36]
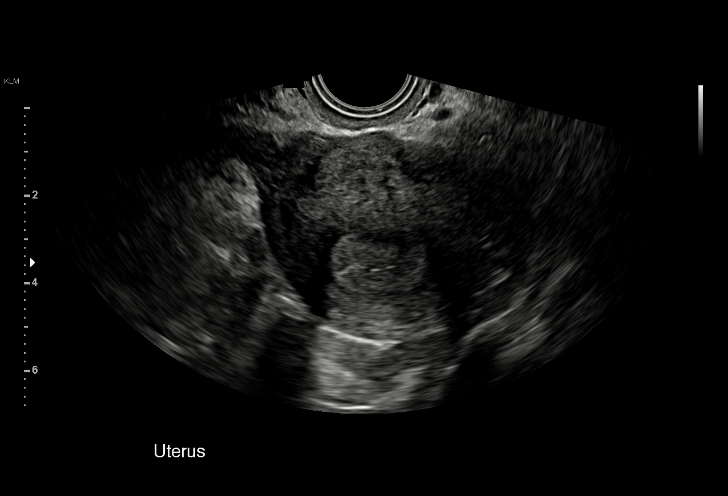
[im 20/36]
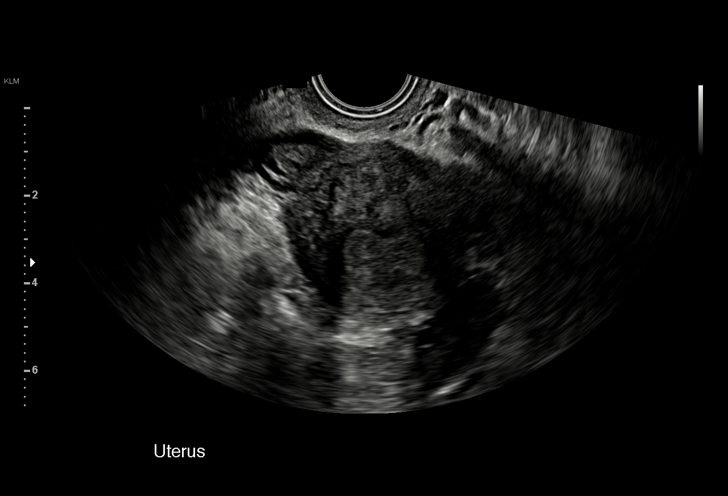
[im 23/36]
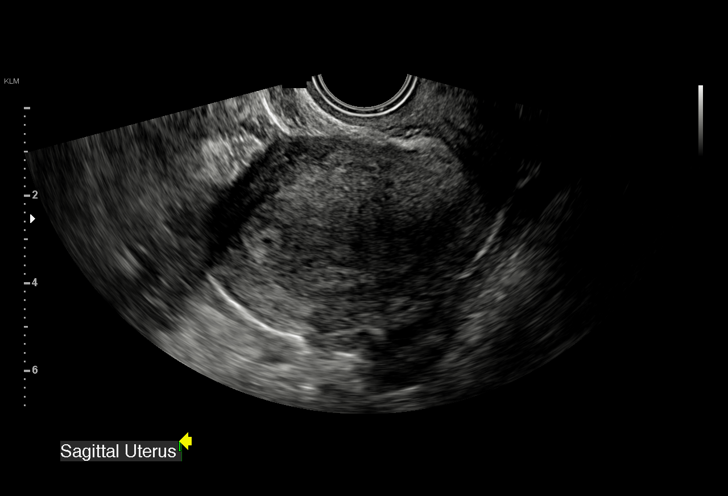
[im 25/36]
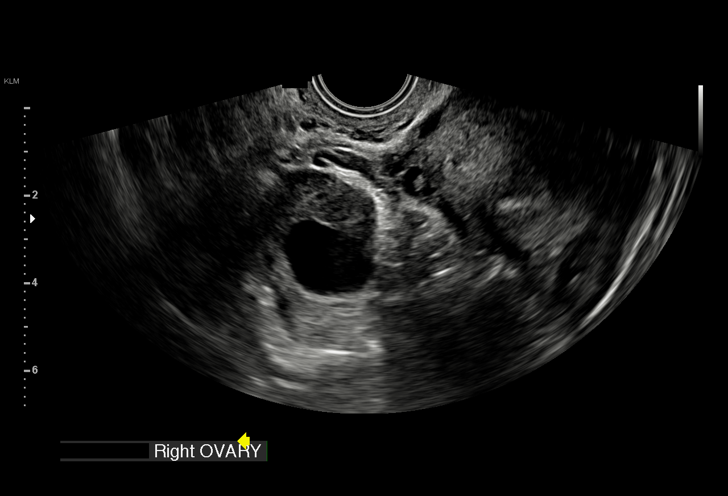
[im 28/36]
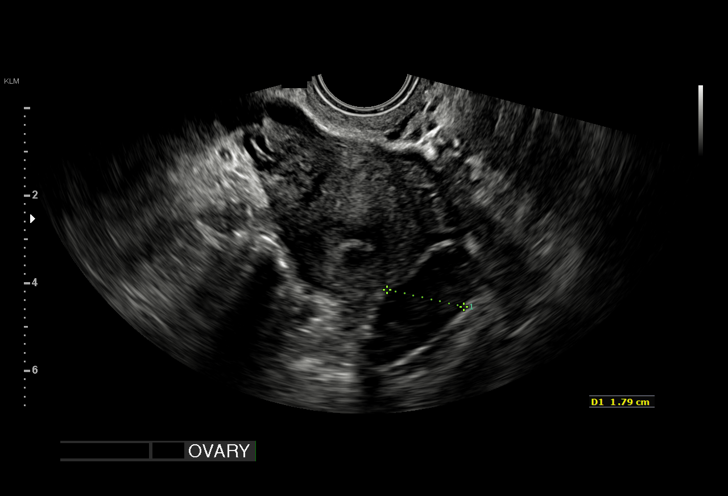
[im 30/36]
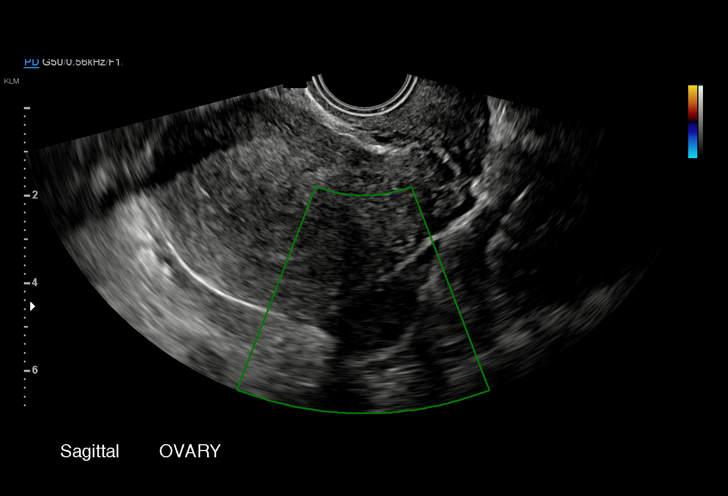
[im 33/36]
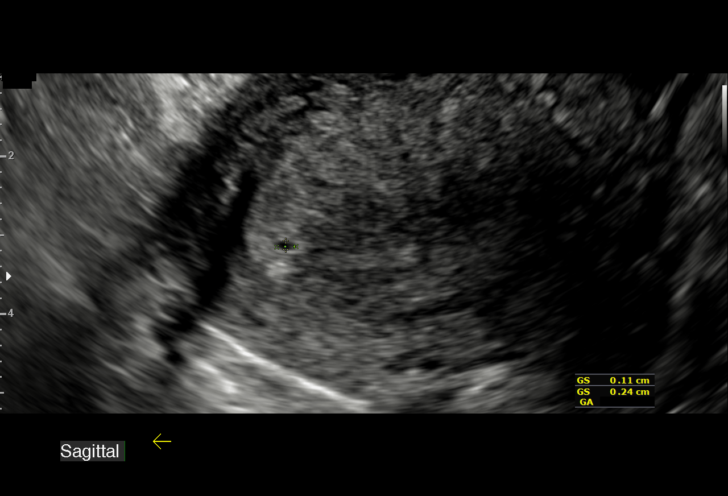
[im 36/36]
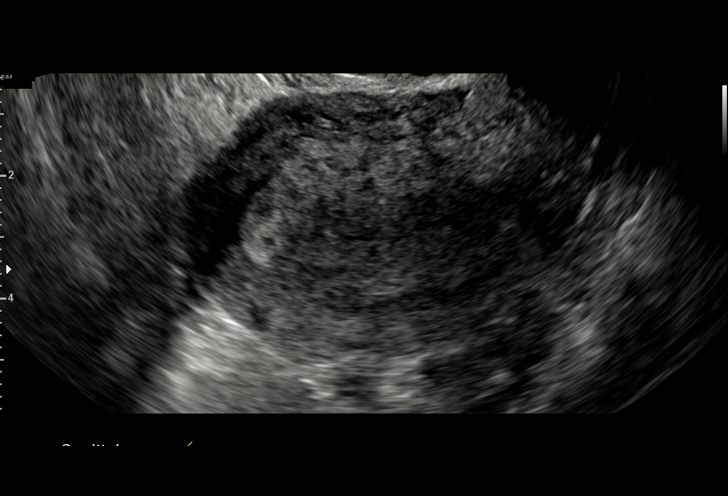

[15 of 28 positions shown; findings below may reference images not displayed]

FINDINGS: Intrauterine gestational sac: Single

Yolk sac:  Not Visualized.

Embryo:  Not Visualized.

Cardiac Activity: Not Visualized.

MSD: 2  mm   4 w   6  d

Subchorionic hemorrhage:  None visualized.

Maternal uterus/adnexae: Right ovary measures 4 x 3.0 x 2.7 cm and
there is a small cyst, probably corpus luteum. The left ovary
measures 2.8 x 1.3 x 1.8 cm
IMPRESSION: Probable early intrauterine gestational sac, but no yolk sac, fetal
pole, or cardiac activity yet visualized. Recommend follow-up
quantitative B-HCG levels and follow-up US in 14 days to assess
viability. This recommendation follows SRU consensus guidelines:
Diagnostic Criteria for Nonviable Pregnancy Early in the First
Trimester. N Engl J Med 9865; [DATE].

By: Muhimin Kinan M.D.

## 2020-01-09 DIAGNOSIS — H52223 Regular astigmatism, bilateral: Secondary | ICD-10-CM | POA: Diagnosis not present

## 2020-03-09 DIAGNOSIS — E559 Vitamin D deficiency, unspecified: Secondary | ICD-10-CM

## 2020-03-09 HISTORY — DX: Vitamin D deficiency, unspecified: E55.9

## 2020-03-17 ENCOUNTER — Ambulatory Visit (INDEPENDENT_AMBULATORY_CARE_PROVIDER_SITE_OTHER): Payer: Medicaid Other | Admitting: Family Medicine

## 2020-03-17 ENCOUNTER — Encounter: Payer: Self-pay | Admitting: Family Medicine

## 2020-03-17 ENCOUNTER — Other Ambulatory Visit: Payer: Self-pay

## 2020-03-17 VITALS — BP 114/76 | HR 64 | Temp 98.8°F | Ht 63.0 in | Wt 214.2 lb

## 2020-03-17 DIAGNOSIS — F32A Depression, unspecified: Secondary | ICD-10-CM

## 2020-03-17 DIAGNOSIS — R0602 Shortness of breath: Secondary | ICD-10-CM

## 2020-03-17 DIAGNOSIS — Z09 Encounter for follow-up examination after completed treatment for conditions other than malignant neoplasm: Secondary | ICD-10-CM

## 2020-03-17 DIAGNOSIS — R002 Palpitations: Secondary | ICD-10-CM | POA: Diagnosis not present

## 2020-03-17 DIAGNOSIS — Z7689 Persons encountering health services in other specified circumstances: Secondary | ICD-10-CM

## 2020-03-17 DIAGNOSIS — Z0000 Encounter for general adult medical examination without abnormal findings: Secondary | ICD-10-CM

## 2020-03-17 LAB — POCT URINALYSIS DIPSTICK
Bilirubin, UA: NEGATIVE
Blood, UA: NEGATIVE
Glucose, UA: NEGATIVE
Ketones, UA: NEGATIVE
Leukocytes, UA: NEGATIVE
Nitrite, UA: NEGATIVE
Protein, UA: NEGATIVE
Spec Grav, UA: >=1.03 — AB (ref 1.010–1.025)
Urobilinogen, UA: 0.2 E.U./dL
pH, UA: 6 (ref 5.0–8.0)

## 2020-03-17 LAB — POCT GLYCOSYLATED HEMOGLOBIN (HGB A1C)
HbA1c POC (<> result, manual entry): 5.3 % (ref 4.0–5.6)
HbA1c, POC (controlled diabetic range): 5.3 % (ref 0.0–7.0)
HbA1c, POC (prediabetic range): 5.3 % — AB (ref 5.7–6.4)
Hemoglobin A1C: 5.3 % (ref 4.0–5.6)

## 2020-03-17 LAB — GLUCOSE, POCT (MANUAL RESULT ENTRY): POC Glucose: 93 mg/dl (ref 70–99)

## 2020-03-17 NOTE — Progress Notes (Signed)
[facility]    New Patient--Establish Care  Subjective:  Patient ID: Caitlin Good, female    DOB: 02/03/1996  Age: 24 y.o. MRN: [id number]  CC:  Chief Complaint  Patient presents with  . Establish Care    HPI Caitlin Good is a 24 year old female who presents to Establish Care today.    Patient Active Problem List   Diagnosis Date Noted  . Postpartum care following vaginal delivery 10/24/2017  . Status post induction of labor 10/23/2017  . SVD (spontaneous vaginal delivery) 10/23/2017  . Gestational diabetes mellitus (GDM), antepartum 09/21/2017  . Pregnancy of unknown anatomic location 09/21/2016     Current Status: This will be [name]'s initial office visit with me. She was previously seeing OB-GYN a few years ago for her PCP needs. Since her last office visit, she has c/o intermittent heart palpitations. She has been having signs more frequently lately. She experiences occasional shortness of breath. She denies fevers, chills, fatigue, recent infections, weight loss, and night sweats. She has not had any headaches, visual changes, dizziness, and falls. No chest pain, heart palpitations,and cough. No reports of GI problems such as nausea, vomiting, diarrhea, and constipation. She has no reports of blood in stools, dysuria and hematuria. No depression or anxiety reported today. She is taking all medications as prescribed. She denies pain today.    Past Medical History:  Diagnosis Date  . Depression    No recent meds (used Lexapro)  . Heart palpitations     Past Surgical History:  Procedure Laterality Date  . ADENOIDECTOMY    . TONSILLECTOMY    . VAGINAL DELIVERY     2019    Family History  Problem Relation Age of Onset  . Hypertension Mother   . Diabetes Mother   . Stroke Mother   . Depression Sister   . Polycystic ovary syndrome Sister   . Heart Problems Brother        palpitations  . Cancer Maternal  Grandmother        unknown type of cancer  . Cancer Maternal Grandfather        unknown type of cancer    Social History   Socioeconomic History  . Marital status: Single    Spouse name: Not on file  . Number of children: Not on file  . Years of education: Not on file  . Highest education level: Not on file  Occupational History  . Not on file  Tobacco Use  . Smoking status: Never Smoker  . Smokeless tobacco: Never Used  Substance and Sexual Activity  . Alcohol use: No  . Drug use: No  . Sexual activity: Yes    Birth control/protection: None  Other Topics Concern  . Not on file  Social History Narrative  . Not on file   Social Determinants of Health   Financial Resource Strain:   . Difficulty of Paying Living Expenses: Not on file  Food Insecurity:   . Worried About Programme researcher, broadcasting/film/video in the Last Year: Not on file  . Ran Out of Food in the Last Year: Not on file  Transportation Needs:   . Lack of Transportation (Medical): Not on file  . Lack of Transportation (Non-Medical): Not on file  Physical Activity:   . Days of Exercise per Week: Not on file  . Minutes of Exercise per Session: Not on file  Stress:   . Feeling of Stress : Not  on file  Social Connections:   . Frequency of Communication with Friends and Family: Not on file  . Frequency of Social Gatherings with Friends and Family: Not on file  . Attends Religious Services: Not on file  . Active Member of Clubs or Organizations: Not on file  . Attends Banker Meetings: Not on file  . Marital Status: Not on file  Intimate Partner Violence:   . Fear of Current or Ex-Partner: Not on file  . Emotionally Abused: Not on file  . Physically Abused: Not on file  . Sexually Abused: Not on file    Outpatient Medications Prior to Visit  Medication Sig Dispense Refill  . ibuprofen (ADVIL,MOTRIN) 600 MG tablet Take 1 tablet (600 mg total) by mouth every 6 (six) hours. (Patient not taking: Reported on  03/17/2020) 30 tablet 0  . Prenatal Vit-Fe Fumarate-FA (PRENATAL MULTIVITAMIN) TABS tablet Take 1 tablet by mouth daily at 12 noon. (Patient not taking: Reported on 03/17/2020)     No facility-administered medications prior to visit.    No Known Allergies  ROS Review of Systems  Constitutional: Negative.   HENT: Negative.   Eyes: Negative.   Respiratory: Positive for shortness of breath (occasional ).   Cardiovascular: Positive for palpitations (frequent).  Gastrointestinal: Negative.   Endocrine: Negative.   Genitourinary: Negative.   Musculoskeletal: Negative.   Skin: Negative.   Allergic/Immunologic: Negative.   Neurological: Negative.   Hematological: Negative.   Psychiatric/Behavioral: Negative.       Objective:    Physical Exam Vitals and nursing note reviewed.  Constitutional:      Appearance: Normal appearance.  HENT:     Head: Normocephalic and atraumatic.     Nose: Nose normal.     Mouth/Throat:     Mouth: Mucous membranes are moist.     Pharynx: Oropharynx is clear.  Cardiovascular:     Rate and Rhythm: Normal rate and regular rhythm.     Pulses: Normal pulses.     Heart sounds: Normal heart sounds.  Pulmonary:     Effort: Pulmonary effort is normal.     Breath sounds: Normal breath sounds.  Abdominal:     General: Bowel sounds are normal.     Palpations: Abdomen is soft.  Musculoskeletal:        General: Normal range of motion.     Cervical back: Normal range of motion and neck supple.  Skin:    General: Skin is warm and dry.  Neurological:     General: No focal deficit present.     Mental Status: She is alert and oriented to person, place, and time.  Psychiatric:        Mood and Affect: Mood normal.        Behavior: Behavior normal.        Thought Content: Thought content normal.        Judgment: Judgment normal.     BP 114/76 (BP Location: Left Arm, Patient Position: Sitting, Cuff Size: Large)   Pulse 64   Temp 98.8 F (37.1 C)   Ht 5'  3" (1.6 m)   Wt 214 lb 3.2 oz (97.2 kg)   LMP 03/07/2020   BMI 37.94 kg/m  Wt Readings from Last 3 Encounters:  03/17/20 214 lb 3.2 oz (97.2 kg)  10/23/17 246 lb 9.6 oz (111.9 kg)  10/06/17 234 lb (106.1 kg)     Health Maintenance Due  Topic Date Due  . Hepatitis C Screening  Never done  .  COVID-19 Vaccine (1) Never done  . PAP-Cervical Cytology Screening  Never done  . PAP SMEAR-Modifier  Never done    There are no preventive care reminders to display for this patient.  No results found for: TSH Lab Results  Component Value Date   WBC 17.1 (H) 10/24/2017   HGB 9.1 (L) 10/24/2017   HCT 27.2 (L) 10/24/2017   MCV 78.8 10/24/2017   PLT 329 10/24/2017   Lab Results  Component Value Date   NA 139 03/10/2016   K 3.7 03/10/2016   GLUCOSE 104 (H) 03/10/2016   BUN 18 03/10/2016   CREATININE 0.70 03/10/2016   No results found for: CHOL No results found for: HDL No results found for: LDLCALC No results found for: TRIG No results found for: CHOLHDL No results found for: ZJQB3A    Assessment & Plan:   1. Establishing care with new doctor, encounter for - Urinalysis Dipstick - POC Glucose (CBG) - POC HgB A1c  2. Heart palpitations Normal EKG today. Monitor.  - EKG 12-Lead - Ambulatory referral to Cardiology  3. Shortness of breath Stab.e today. No signs or symptoms of respiratory distress noted or reported.  - EKG 12-Lead - Ambulatory referral to Cardiology  4. Depression, unspecified depression type  5. Healthcare maintenance - CBC with Differential - Comprehensive metabolic panel - TSH - Vitamin B12 - Vitamin D, 25-hydroxy - Hemoglobin A1c  6. Follow up She will follow up in 1 month.   No orders of the defined types were placed in this encounter.   Orders Placed This Encounter  Procedures  . CBC with Differential  . Comprehensive metabolic panel  . TSH  . Vitamin B12  . Vitamin D, 25-hydroxy  . Hemoglobin A1c  . Ambulatory referral to  Cardiology  . Urinalysis Dipstick  . POC Glucose (CBG)  . POC HgB A1c  . EKG 12-Lead     Referral Orders     Ambulatory referral to Cardiology  Raliegh Ip,  MSN, FNP-BC [facility] [address] [place], [place] [address] [phone] fax  Problem List Items Addressed This Visit    None    Visit Diagnoses    Establishing care with new doctor, encounter for    -  Primary   Relevant Orders   Urinalysis Dipstick   POC Glucose (CBG)   POC HgB A1c   Heart palpitations       Relevant Orders   EKG 12-Lead   Ambulatory referral to Cardiology   Shortness of breath       Relevant Orders   EKG 12-Lead   Ambulatory referral to Cardiology   Depression, unspecified depression type       Healthcare maintenance       Relevant Orders   CBC with Differential   Comprehensive metabolic panel   TSH   Vitamin B12   Vitamin D, 25-hydroxy   Hemoglobin A1c   Follow up          No orders of the defined types were placed in this encounter.   Follow-up: Return in about 1 month (around 04/16/2020).    Kallie Locks, FNP

## 2020-03-18 ENCOUNTER — Encounter: Payer: Self-pay | Admitting: Family Medicine

## 2020-03-18 ENCOUNTER — Other Ambulatory Visit: Payer: Self-pay | Admitting: Family Medicine

## 2020-03-18 DIAGNOSIS — E559 Vitamin D deficiency, unspecified: Secondary | ICD-10-CM

## 2020-03-18 LAB — COMPREHENSIVE METABOLIC PANEL
ALT: 21 IU/L (ref 0–32)
AST: 16 IU/L (ref 0–40)
Albumin/Globulin Ratio: 1.9 (ref 1.2–2.2)
Albumin: 4.4 g/dL (ref 3.9–5.0)
Alkaline Phosphatase: 72 IU/L (ref 44–121)
BUN/Creatinine Ratio: 21 (ref 9–23)
BUN: 14 mg/dL (ref 6–20)
Bilirubin Total: 0.4 mg/dL (ref 0.0–1.2)
CO2: 22 mmol/L (ref 20–29)
Calcium: 9.5 mg/dL (ref 8.7–10.2)
Chloride: 104 mmol/L (ref 96–106)
Creatinine, Ser: 0.68 mg/dL (ref 0.57–1.00)
GFR calc Af Amer: 142 mL/min/{1.73_m2} (ref >59)
GFR calc non Af Amer: 123 mL/min/{1.73_m2} (ref >59)
Globulin, Total: 2.3 g/dL (ref 1.5–4.5)
Glucose: 86 mg/dL (ref 65–99)
Potassium: 4.6 mmol/L (ref 3.5–5.2)
Sodium: 137 mmol/L (ref 134–144)
Total Protein: 6.7 g/dL (ref 6.0–8.5)

## 2020-03-18 LAB — TSH: TSH: 0.684 u[IU]/mL (ref 0.450–4.500)

## 2020-03-18 LAB — VITAMIN B12: Vitamin B-12: 462 pg/mL (ref 232–1245)

## 2020-03-18 LAB — VITAMIN D 25 HYDROXY (VIT D DEFICIENCY, FRACTURES): Vit D, 25-Hydroxy: 12 ng/mL — ABNORMAL LOW (ref 30.0–100.0)

## 2020-03-18 LAB — CBC WITH DIFFERENTIAL/PLATELET
Basophils Absolute: 0 10*3/uL (ref 0.0–0.2)
Basos: 1 %
EOS (ABSOLUTE): 0.1 10*3/uL (ref 0.0–0.4)
Eos: 1 %
Hematocrit: 39.7 % (ref 34.0–46.6)
Hemoglobin: 12.8 g/dL (ref 11.1–15.9)
Immature Grans (Abs): 0 10*3/uL (ref 0.0–0.1)
Immature Granulocytes: 0 %
Lymphocytes Absolute: 2.8 10*3/uL (ref 0.7–3.1)
Lymphs: 38 %
MCH: 27.6 pg (ref 26.6–33.0)
MCHC: 32.2 g/dL (ref 31.5–35.7)
MCV: 86 fL (ref 79–97)
Monocytes Absolute: 0.5 10*3/uL (ref 0.1–0.9)
Monocytes: 7 %
Neutrophils Absolute: 3.8 10*3/uL (ref 1.4–7.0)
Neutrophils: 53 %
Platelets: 361 10*3/uL (ref 150–450)
RBC: 4.64 x10E6/uL (ref 3.77–5.28)
RDW: 13.1 % (ref 11.7–15.4)
WBC: 7.3 10*3/uL (ref 3.4–10.8)

## 2020-03-18 LAB — HEMOGLOBIN A1C
Est. average glucose Bld gHb Est-mCnc: 117 mg/dL
Hgb A1c MFr Bld: 5.7 % — ABNORMAL HIGH (ref 4.8–5.6)

## 2020-03-18 MED ORDER — VITAMIN D (ERGOCALCIFEROL) 1.25 MG (50000 UNIT) PO CAPS: 50000.0000 [IU] | ORAL_CAPSULE | ORAL | 6 refills | Status: DC

## 2020-03-18 NOTE — Progress Notes (Signed)
Pt was called to discuss her lab results. Pt stated she understood and will keep her f/u appt. 

## 2020-03-23 ENCOUNTER — Encounter: Payer: Self-pay | Admitting: General Practice

## 2020-04-17 ENCOUNTER — Ambulatory Visit: Payer: Medicaid Other | Admitting: Family Medicine

## 2020-06-26 ENCOUNTER — Other Ambulatory Visit: Payer: Self-pay

## 2020-06-26 ENCOUNTER — Ambulatory Visit (INDEPENDENT_AMBULATORY_CARE_PROVIDER_SITE_OTHER): Payer: Medicaid Other | Admitting: Family Medicine

## 2020-06-26 ENCOUNTER — Encounter: Payer: Self-pay | Admitting: Family Medicine

## 2020-06-26 VITALS — BP 137/70 | HR 76 | Temp 98.6°F | Ht 63.0 in | Wt 212.0 lb

## 2020-06-26 DIAGNOSIS — R002 Palpitations: Secondary | ICD-10-CM | POA: Diagnosis not present

## 2020-06-26 DIAGNOSIS — Z09 Encounter for follow-up examination after completed treatment for conditions other than malignant neoplasm: Secondary | ICD-10-CM

## 2020-06-26 DIAGNOSIS — Z7689 Persons encountering health services in other specified circumstances: Secondary | ICD-10-CM

## 2020-06-26 DIAGNOSIS — R0602 Shortness of breath: Secondary | ICD-10-CM

## 2020-06-26 DIAGNOSIS — G8929 Other chronic pain: Secondary | ICD-10-CM

## 2020-06-26 DIAGNOSIS — R519 Headache, unspecified: Secondary | ICD-10-CM | POA: Diagnosis not present

## 2020-06-26 MED ORDER — BUTALBITAL-APAP-CAFFEINE 50-325-40 MG PO TABS: 1.0000 | ORAL_TABLET | Freq: Two times a day (BID) | ORAL | 0 refills | Status: DC | PRN

## 2020-06-26 NOTE — Progress Notes (Signed)
[facility]   Established Patient Office Visit  Subjective:  Patient ID: Caitlin Good, female    DOB: [birth year]  Age: 25 y.o. MRN: [id number]  CC:  Chief Complaint  Patient presents with  . Arm Pain    Arm pain rt , pain , numbness , pain on side on pain , last about 20 minutes , started this weeks, hx of headaches, has gotten worse of the last week . Taking otc ibuprofen help some with pain then comeback , no  injury, some blurred on vision , tingling with numbness on rt side arm from neck pain into hand     HPI Caitlin Good is a 25 year old female who presents for Follow Up today.    Patient Active Problem List   Diagnosis Date Noted  . Postpartum care following vaginal delivery 10/24/2017  . Status post induction of labor 10/23/2017  . SVD (spontaneous vaginal delivery) 10/23/2017  . Gestational diabetes mellitus (GDM), antepartum 09/21/2017  . Pregnancy of unknown anatomic location 09/21/2016    Current Status: Since her last office visit, she had c/o right sided headaches X 1 week now. She has been taking Motrin and Excedrin, which helps minimally. She is drinking at least 64 oz of water daily. Her anxiety is moderate today, as she has been in court with a case concerning her daughter. She denies suicidal ideations, homicidal ideations, or auditory hallucinations. She denies fevers, chills, fatigue, recent infections, weight loss, and night sweats. She has not had any headaches, visual changes, dizziness, and falls. No chest pain, heart palpitations, cough and shortness of breath reported. Denies GI problems such as nausea, vomiting, diarrhea, and constipation. She has no reports of blood in stools, dysuria and hematuria. She is taking all medications as prescribed. She denies pain today.   Past Medical History:  Diagnosis Date  . Depression    No recent meds (used Lexapro)  . Heart palpitations   . Vitamin D deficiency  03/2020    Past Surgical History:  Procedure Laterality Date  . ADENOIDECTOMY    . TONSILLECTOMY    . VAGINAL DELIVERY     2019    Family History  Problem Relation Age of Onset  . Hypertension Mother   . Diabetes Mother   . Stroke Mother   . Depression Sister   . Polycystic ovary syndrome Sister   . Heart Problems Brother        palpitations  . Cancer Maternal Grandmother        unknown type of cancer  . Cancer Maternal Grandfather        unknown type of cancer    Social History   Socioeconomic History  . Marital status: Single    Spouse name: Not on file  . Number of children: Not on file  . Years of education: Not on file  . Highest education level: Not on file  Occupational History  . Not on file  Tobacco Use  . Smoking status: Never Smoker  . Smokeless tobacco: Never Used  Substance and Sexual Activity  . Alcohol use: No  . Drug use: No  . Sexual activity: Yes    Birth control/protection: None  Other Topics Concern  . Not on file  Social History Narrative  . Not on file   Social Determinants of Health   Financial Resource Strain: Not on file  Food Insecurity: Not on file  Transportation Needs: Not on file  Physical Activity:  Not on file  Stress: Not on file  Social Connections: Not on file  Intimate Partner Violence: Not on file    Outpatient Medications Prior to Visit  Medication Sig Dispense Refill  . ibuprofen (ADVIL) 200 MG tablet Take 200 mg by mouth every 6 (six) hours as needed.    . Vitamin D, Ergocalciferol, (DRISDOL) 1.25 MG (50000 UNIT) CAPS capsule Take 1 capsule (50,000 Units total) by mouth every 7 (seven) days. 5 capsule 6   No facility-administered medications prior to visit.    No Known Allergies  ROS Review of Systems  Constitutional: Negative.   HENT: Negative.   Eyes: Negative.   Respiratory: Negative.   Cardiovascular: Negative.   Gastrointestinal: Negative.   Endocrine: Negative.   Genitourinary: Negative.    Musculoskeletal: Negative.   Skin: Negative.   Allergic/Immunologic: Negative.   Neurological: Positive for dizziness (occasional ) and headaches (occasional ).  Hematological: Negative.   Psychiatric/Behavioral: Negative.       Objective:    Physical Exam Vitals and nursing note reviewed.  Constitutional:      Appearance: Normal appearance.  HENT:     Head: Normocephalic and atraumatic.     Nose: Nose normal.     Mouth/Throat:     Mouth: Mucous membranes are moist.     Pharynx: Oropharynx is clear.  Cardiovascular:     Rate and Rhythm: Normal rate and regular rhythm.     Pulses: Normal pulses.     Heart sounds: Normal heart sounds.  Pulmonary:     Effort: Pulmonary effort is normal.     Breath sounds: Normal breath sounds.  Abdominal:     General: Bowel sounds are normal.     Palpations: Abdomen is soft.  Musculoskeletal:        General: Normal range of motion.     Cervical back: Normal range of motion and neck supple.  Skin:    General: Skin is warm and dry.  Neurological:     General: No focal deficit present.     Mental Status: She is alert and oriented to person, place, and time.  Psychiatric:        Mood and Affect: Mood normal.        Behavior: Behavior normal.        Thought Content: Thought content normal.        Judgment: Judgment normal.     BP 137/70 (BP Location: Left Arm)   Pulse 76   Temp 98.6 F (37 C) (Temporal)   Ht 5\' 3"  (1.6 m)   Wt 212 lb (96.2 kg)   SpO2 98%   BMI 37.55 kg/m  Wt Readings from Last 3 Encounters:  06/26/20 212 lb (96.2 kg)  03/17/20 214 lb 3.2 oz (97.2 kg)  10/23/17 246 lb 9.6 oz (111.9 kg)     Health Maintenance Due  Topic Date Due  . Hepatitis C Screening  Never done  . COVID-19 Vaccine (1) Never done  . PAP-Cervical Cytology Screening  Never done  . PAP SMEAR-Modifier  Never done    There are no preventive care reminders to display for this patient.  Lab Results  Component Value Date   TSH 0.684  03/17/2020   Lab Results  Component Value Date   WBC 7.3 03/17/2020   HGB 12.8 03/17/2020   HCT 39.7 03/17/2020   MCV 86 03/17/2020   PLT 361 03/17/2020   Lab Results  Component Value Date   NA 137 03/17/2020   K  4.6 03/17/2020   CO2 22 03/17/2020   GLUCOSE 86 03/17/2020   BUN 14 03/17/2020   CREATININE 0.68 03/17/2020   BILITOT 0.4 03/17/2020   ALKPHOS 72 03/17/2020   AST 16 03/17/2020   ALT 21 03/17/2020   PROT 6.7 03/17/2020   ALBUMIN 4.4 03/17/2020   CALCIUM 9.5 03/17/2020   No results found for: CHOL No results found for: HDL No results found for: LDLCALC No results found for: TRIG No results found for: CHOLHDL Lab Results  Component Value Date   HGBA1C 5.3 03/17/2020   HGBA1C 5.3 03/17/2020   HGBA1C 5.3 (A) 03/17/2020   HGBA1C 5.3 03/17/2020   Assessment & Plan:   1. Heart palpitations Stable.   2. Shortness of breath Stable today. No signs or symptoms of respiratory distress noted or reported today.   4. Chronic nonintractable headache, unspecified headache type We will initiate Fioricet today. She will take only as needed when Motrin, Excedrin and Acetaminophen are not effective. - butalbital-acetaminophen-caffeine (FIORICET) 50-325-40 MG tablet; Take 1-2 tablets by mouth 2 (two) times daily as needed for headache.  Dispense: 20 tablet; Refill: 0  5. Follow up She will follow in 6 months.   Meds ordered this encounter  Medications  . butalbital-acetaminophen-caffeine (FIORICET) 50-325-40 MG tablet    Sig: Take 1-2 tablets by mouth 2 (two) times daily as needed for headache.    Dispense:  20 tablet    Refill:  0   No orders of the defined types were placed in this encounter.   Raliegh Ip, MSN, ANE, FNP-BC [facility] [address] [place], [place] [address] [phone] fax  Problem List Items Addressed This Visit   None   Visit  Diagnoses    Heart palpitations    -  Primary   Establishing care with new doctor, encounter for       Shortness of breath       Chronic nonintractable headache, unspecified headache type       Relevant Medications   ibuprofen (ADVIL) 200 MG tablet   butalbital-acetaminophen-caffeine (FIORICET) 50-325-40 MG tablet   Follow up          Meds ordered this encounter  Medications  . butalbital-acetaminophen-caffeine (FIORICET) 50-325-40 MG tablet    Sig: Take 1-2 tablets by mouth 2 (two) times daily as needed for headache.    Dispense:  20 tablet    Refill:  0    Follow-up: No follow-ups on file.    Kallie Locks, FNP

## 2020-06-26 NOTE — Patient Instructions (Signed)
Butalbital; Acetaminophen; Caffeine Tablets or Capsules What is this medicine? BUTALBITAL; ACETAMINOPHEN; CAFFEINE (byoo TAL bi tal; a set a MEE noe fen; KAF een) is a pain reliever. It is used to treat tension headaches. This medicine may be used for other purposes; ask your health care provider or pharmacist if you have questions. COMMON BRAND NAME(S): Alagesic, Americet, Anolor-300, Arcet, BAC, CAPACET, Dolgic Plus, Esgic, Esgic Plus, Ezol, Fioricet, Ryder System, Medigesic, [place], [address] [place], Phrenilin Forte, Repan, [place], Triad, Zebutal What should I tell my health care provider before I take this medicine? They need to know if you have any of these conditions:  drug abuse or addiction  heart or circulation problems  if you often drink alcohol  kidney disease or problems going to the bathroom  liver disease  lung disease, asthma, or breathing problems  porphyria  an unusual or allergic reaction to acetaminophen, butalbital or other barbiturates, caffeine, other medicines, foods, dyes, or preservatives  pregnant or trying to get pregnant  breast-feeding How should I use this medicine? Take this medicine by mouth with a full glass of water. Follow the directions on the prescription label. If the medicine upsets your stomach, take the medicine with food or milk. Do not take more than you are told to take. Talk to your pediatrician regarding the use of this medicine in children. Special care may be needed. Overdosage: If you think you have taken too much of this medicine contact a poison control center or emergency room at once. NOTE: This medicine is only for you. Do not share this medicine with others. What if I miss a dose? If you miss a dose, take it as soon as you can. If it is almost time for your next dose, take only that dose. Do not take double or extra doses. What may interact with this medicine?  alcohol or medicines that contain alcohol  antidepressants,  especially MAOIs like isocarboxazid, phenelzine, tranylcypromine, and selegiline  antihistamines  benzodiazepines  carbamazepine  isoniazid  medicines for pain like pentazocine, buprenorphine, butorphanol, nalbuphine, tramadol, and propoxyphene  muscle relaxants  naltrexone  phenobarbital, phenytoin, and fosphenytoin  phenothiazines like perphenazine, thioridazine, chlorpromazine, mesoridazine, fluphenazine, prochlorperazine, promazine, and trifluoperazine  voriconazole This list may not describe all possible interactions. Give your health care provider a list of all the medicines, herbs, non-prescription drugs, or dietary supplements you use. Also tell them if you smoke, drink alcohol, or use illegal drugs. Some items may interact with your medicine. What should I watch for while using this medicine? Tell your doctor or health care professional if your pain does not go away, if it gets worse, or if you have new or a different type of pain. You may develop tolerance to the medicine. Tolerance means that you will need a higher dose of the medicine for pain relief. Tolerance is normal and is expected if you take the medicine for a long time. Do not suddenly stop taking your medicine because you may develop a severe reaction. Your body becomes used to the medicine. This does NOT mean you are addicted. Addiction is a behavior related to getting and using a drug for a non-medical reason. If you have pain, you have a medical reason to take pain medicine. Your doctor will tell you how much medicine to take. If your doctor wants you to stop the medicine, the dose will be slowly lowered over time to avoid any side effects. You may get drowsy or dizzy when you first start taking the medicine or  change doses. Do not drive, use machinery, or do anything that may be dangerous until you know how the medicine affects you. Stand or sit up slowly. Do not take other medicines that contain acetaminophen with  this medicine. Always read labels carefully. If you have questions, ask your doctor or pharmacist. If you take too much acetaminophen get medical help right away. Too much acetaminophen can be very dangerous and cause liver damage. Even if you do not have symptoms, it is important to get help right away. What side effects may I notice from receiving this medicine? Side effects that you should report to your doctor or health care professional as soon as possible:  allergic reactions like skin rash, itching or hives, swelling of the face, lips, or tongue  breathing problems  confusion  feeling faint or lightheaded, falls  redness, blistering, peeling or loosening of the skin, including inside the mouth  seizure  stomach pain  yellowing of the eyes or skin Side effects that usually do not require medical attention (report to your doctor or health care professional if they continue or are bothersome):  constipation  nausea, vomiting This list may not describe all possible side effects. Call your doctor for medical advice about side effects. You may report side effects to FDA at 1-800-FDA-1088. Where should I keep my medicine? Keep out of the reach of children. This medicine can be abused. Keep your medicine in a safe place to protect it from theft. Do not share this medicine with anyone. Selling or giving away this medicine is dangerous and against the law. This medicine may cause accidental overdose and death if it taken by other adults, children, or pets. Mix any unused medicine with a substance like cat litter or coffee grounds. Then throw the medicine away in a sealed container like a sealed bag or a coffee can with a lid. Do not use the medicine after the expiration date. Store at room temperature between 15 and 30 degrees C (59 and 86 degrees F). NOTE: This sheet is a summary. It may not cover all possible information. If you have questions about this medicine, talk to your doctor,  pharmacist, or health care provider.  2021 Elsevier/Gold Standard (2019-03-12 11:51:18) Chronic Migraine Headache A migraine is a type of headache that is usually stronger and more sudden than other headaches. Migraines are characterized by an intense pulsing, throbbing pain that is usually only present on one side of the head. Migraine pain usually gets worse with activity. Migraines can cause nausea, vomiting, sensitivity to light and sound, and vision changes. Migraines that keep coming back are called recurrent migraines. A migraine is called a chronic migraine if it happens at least 15 days in a month for more than 3 months. Talk with your health care provider about what things may bring on (trigger) your migraines. What are the causes? The exact cause of this condition is not known. However, a migraine may be caused when nerves in the brain become irritated and release chemicals that cause inflammation of blood vessels. The inflammation of the blood vessels causes pain. Migraines may be triggered or caused by:  Smoking.  Certain foods and drinks, such as: ? Aged cheese. ? Chocolate. ? Alcohol. ? Caffeine. ? Foods or drinks that contain nitrates, glutamate, aspartame, MSG, or tyramine.  Medicines, such as birth control pills or some blood pressure medicines. Other things that may trigger a migraine include:  Menstruation.  Emotional stress.  Lack of sleep or too much  sleep.  Tiredness (fatigue).  Bright lights or loud noises.  Odors.  Weather changes and high altitude. What increases the risk? The following factors may make you more likely to experience chronic migraine:  Having migraines or a family history of migraines.  Having a mental health condition, such as depression or anxiety.  Having to take a lot of pain medicine.  Having sleep problems.  Having heart disease, diabetes, or obesity. What are the signs or symptoms? Symptoms of a migraine vary for each  person and may include:  Pulsating or throbbing pain.  Pain that is usually only present on one side of the head. In some cases, the pain may be on both sides of the head or around the head or neck.  Severe pain that prevents you from doing daily activities.  Pain that gets worse with physical activity.  Nausea, vomiting, or both.  Pain with exposure to bright lights, loud noises, or activity.  General sensitivity to bright lights, loud noises, or smells.  Dizziness. A sign that a migraine is becoming chronic is an increasing number of migraine episodes. It is considered chronic if the migraine happens at least 15 days in a month for more than 3 months. How is this diagnosed? This condition is often diagnosed based on:  Your symptoms and medical history.  A physical exam. You may also have tests, including:  A CT scan or an MRI of your brain. These imaging tests cannot diagnose migraines, but they can help to rule out other causes of headaches.  Taking fluid from the spine (lumbar puncture) and analyzing it (cerebrospinal fluid analysis, or CSF analysis).  Blood tests. How is this treated? This condition is treated with:  Medicines. These help to: ? Lessen pain and nausea. ? Prevent migraines.  Lifestyle changes, such as changes to your diet or sleeping patterns.  Behavior therapy. This may include: ? Relaxation training. ? Biofeedback. This is a treatment that teaches you to relax and use your brain to lower your heart rate and control your breathing. ? Cognitive behavioral therapy (CBT). This is a form of talk therapy. This therapy helps you set goals and follow up on the changes that you make.  Acupuncture.  Using a device that provides electrical stimulation to your nerves, which can relieve pain (neuromodulation therapy).  Surgery, if the other treatments are not working. Follow these instructions at home: Medicines  Take over-the-counter and prescription  medicines only as told by your health care provider.  Ask your health care provider if the medicine prescribed to you requires you to avoid driving or using machinery. Lifestyle  Do not use any products that contain nicotine or tobacco, such as cigarettes, e-cigarettes, and chewing tobacco. If you need help quitting, ask your health care provider.  Do not drink alcohol.  Get 7-9 hours of sleep each night, or the amount of sleep recommended by your health care provider.  Find ways to manage stress, such as meditation, deep breathing, or yoga.  Maintain a healthy weight. If you need help losing weight, ask your health care provider.  Exercise regularly. Aim for 150 minutes of moderate-intensity exercise, such as walking, biking, or yoga, or 75 minutes of vigorous exercise each week. Vigorous exercise includes running, circuit training, and swimming.   General instructions  Keep a journal to find out what triggers your migraines so you can avoid these triggers. For example, write down: ? What you eat and drink. ? How much sleep you get. ? Any  change to your diet or medicines.  Lie down in a dark, quiet room when you have a migraine.  Try placing a cool towel over your head when you have a migraine.  Keep lights dim, if bright lights bother you or make your migraines worse.  Keep all follow-up visits as told by your health care provider. This is important.   Where to find more information  Coalition for Headache and Migraine Patients (CHAMP): headachemigraine.org  American Migraine Foundation: americanmigrainefoundation.org  National Headache Foundation: headaches.org Contact a health care provider if:  Your pain does not improve, even with medicine.  Your migraines continue to return, even with medicine. Get help right away if:  Your migraine becomes severe and medicine does not help.  You have a stiff neck and fever.  You have a loss of vision.  You have muscle  weakness or loss of muscle control.  You start losing your balance, or you have trouble walking.  You feel like you may faint, or you faint.  You start having sudden and unexpected, severe headaches.  You have a seizure. Summary  Migraine headaches are usually stronger and more sudden than other headaches. Migraines are characterized by an intense pulsing, throbbing pain that is usually only present on one side of the head.  Migraines that keep coming back are called recurrent migraines. A migraine is called a chronic migraine if it happens 15 days in a month for more than 3 months.  Certain things may trigger migraines, such as lack of sleep or too much sleep, smoking, certain foods, alcohol, stress, and certain medicines.  Your treatment plan may include medicines, lifestyle changes, and behavior therapy. This information is not intended to replace advice given to you by your health care provider. Make sure you discuss any questions you have with your health care provider. Document Revised: 06/12/2019 Document Reviewed: 06/12/2019 Elsevier Patient Education  2021 [employer].

## 2020-06-28 ENCOUNTER — Encounter: Payer: Self-pay | Admitting: Family Medicine

## 2020-07-23 ENCOUNTER — Telehealth: Payer: Medicaid Other | Admitting: Nurse Practitioner

## 2020-07-24 ENCOUNTER — Other Ambulatory Visit: Payer: Self-pay

## 2020-07-24 ENCOUNTER — Telehealth (INDEPENDENT_AMBULATORY_CARE_PROVIDER_SITE_OTHER): Payer: Medicaid Other | Admitting: Nurse Practitioner

## 2020-07-24 ENCOUNTER — Encounter: Payer: Self-pay | Admitting: Nurse Practitioner

## 2020-07-24 VITALS — Ht 63.0 in | Wt 212.0 lb

## 2020-07-24 DIAGNOSIS — R059 Cough, unspecified: Secondary | ICD-10-CM | POA: Diagnosis not present

## 2020-07-24 MED ORDER — BENZONATATE 100 MG PO CAPS: 100.0000 mg | ORAL_CAPSULE | Freq: Three times a day (TID) | ORAL | 0 refills | Status: AC | PRN

## 2020-07-24 NOTE — Progress Notes (Signed)
   [facility] Patient [facility] [address] [place], [place]  [address] Phone:  [phone]   Fax:  [phone] Virtual Visit via Telephone Note  I connected with Tiawana Forgy on 07/24/20 at  3:20 PM EDT by telephone and verified that I am speaking with the correct person using two identifiers.   I discussed the limitations, risks, security and privacy concerns of performing an evaluation and management service by telephone and the availability of in person appointments. I also discussed with the patient that there may be a patient responsible charge related to this service. The patient expressed understanding and agreed to proceed.  Patient home Provider Office  History of Present Illness:  Cough Patient complains of nonproductive cough. Symptoms began 2 weeks ago. Symptoms have been unchanged since that time.The cough is nonproductive and itchy to her throat and is aggravated by  nighttime. Associated symptoms include: running nose . Patient does not have new pets. Patient does not have a history of asthma. Patient does have a history of environmental allergens. Patient has not traveled recently. Patient does not have a history of smoking. Patient has not had a previous chest x-ray. Patient has not noted had a PPD done. She has seasonal allergies. She uses OTC clarinex . She has taken Mucinex fast max and this has not been effective.   She denies bronchitis and smoking . She denies any changes in her environment . She tired dayquil for the cough.     Observations/Objective: No exam due to video visit.  Patient able to speak in full sentences without distress.  No audible cough heard  Assessment and Plan: Assessment  Primary Diagnosis & Pertinent Problem List: The encounter diagnosis was Cough.  Visit Diagnosis: 1. Cough  Benzonatate 100 mg 3 times daily as needed     Follow Up Instructions: Follow-up with primary care provider   I discussed the assessment and treatment  plan with the patient. The patient was provided an opportunity to ask questions and all were answered. The patient agreed with the plan and demonstrated an understanding of the instructions.   The patient was advised to call back or seek an in-person evaluation if the symptoms worsen or if the condition fails to improve as anticipated.  I provided 10 minutes of video- face-to-face time during this encounter.   Barbette Merino, NP

## 2020-08-14 ENCOUNTER — Other Ambulatory Visit: Payer: Self-pay

## 2020-08-14 ENCOUNTER — Ambulatory Visit: Payer: Medicaid Other | Admitting: Nurse Practitioner

## 2020-08-14 ENCOUNTER — Encounter: Payer: Self-pay | Admitting: Nurse Practitioner

## 2020-08-14 VITALS — BP 129/61 | HR 77 | Temp 98.4°F | Ht 63.5 in | Wt 216.6 lb

## 2020-08-14 DIAGNOSIS — R82998 Other abnormal findings in urine: Secondary | ICD-10-CM

## 2020-08-14 DIAGNOSIS — R350 Frequency of micturition: Secondary | ICD-10-CM | POA: Diagnosis not present

## 2020-08-14 DIAGNOSIS — Z113 Encounter for screening for infections with a predominantly sexual mode of transmission: Secondary | ICD-10-CM | POA: Diagnosis not present

## 2020-08-14 LAB — POCT URINALYSIS DIPSTICK
Bilirubin, UA: NEGATIVE
Blood, UA: NEGATIVE
Glucose, UA: NEGATIVE
Ketones, UA: NEGATIVE
Nitrite, UA: NEGATIVE
Protein, UA: POSITIVE — AB
Spec Grav, UA: 1.02 (ref 1.010–1.025)
Urobilinogen, UA: 0.2 E.U./dL
pH, UA: 7 (ref 5.0–8.0)

## 2020-08-14 MED ORDER — NITROFURANTOIN MONOHYD MACRO 100 MG PO CAPS: 100.0000 mg | ORAL_CAPSULE | Freq: Two times a day (BID) | ORAL | 0 refills | Status: AC

## 2020-08-14 MED ORDER — PHENAZOPYRIDINE HCL 100 MG PO TABS: 100.0000 mg | ORAL_TABLET | Freq: Three times a day (TID) | ORAL | 0 refills | Status: DC | PRN

## 2020-08-14 NOTE — Progress Notes (Signed)
Established Patient Office Visit  Subjective:  Patient ID: Caitlin Good, female    DOB: 05/26/1995  Age: 25 y.o. MRN: [id number]  CC:  Chief Complaint  Patient presents with  . poissble uti    Urine freq, burning with urination , started last week , gotten over the last over days, have lower back pain , taking otc meds for the pain , no chills or fever    HPI Caitlin Good presents for UTI. She  has a past medical history of Depression, Heart palpitations, and Vitamin D deficiency (03/2020).   Urinary Tract Infection Patient complains of burning with urination and frequency. She has had symptoms for a few days. Patient also complains of back pain. Patient denies congestion, cough, fever, headache, rhinitis and sorethroat. Patient does not have a history of recurrent UTI. UTI 3 yrs ago. Patient does not have a history of pyelonephritis. Heat pack and APAP. 6/10 pelvic pain. She is sexually active however denies any additional symptoms.   Past Medical History:  Diagnosis Date  . Depression    No recent meds (used Lexapro)  . Heart palpitations   . Vitamin D deficiency 03/2020    Past Surgical History:  Procedure Laterality Date  . ADENOIDECTOMY    . TONSILLECTOMY    . VAGINAL DELIVERY     2019    Family History  Problem Relation Age of Onset  . Hypertension Mother   . Diabetes Mother   . Stroke Mother   . Depression Sister   . Polycystic ovary syndrome Sister   . Heart Problems Brother        palpitations  . Cancer Maternal Grandmother        unknown type of cancer  . Cancer Maternal Grandfather        unknown type of cancer    Social History   Socioeconomic History  . Marital status: Single    Spouse name: Not on file  . Number of children: Not on file  . Years of education: Not on file  . Highest education level: Not on file  Occupational History  . Not on file  Tobacco Use  . Smoking status: Never Smoker  . Smokeless tobacco: Never Used  Substance and  Sexual Activity  . Alcohol use: No  . Drug use: No  . Sexual activity: Yes    Birth control/protection: None  Other Topics Concern  . Not on file  Social History Narrative  . Not on file   Social Determinants of Health   Financial Resource Strain: Not on file  Food Insecurity: Not on file  Transportation Needs: Not on file  Physical Activity: Not on file  Stress: Not on file  Social Connections: Not on file  Intimate Partner Violence: Not on file    Outpatient Medications Prior to Visit  Medication Sig Dispense Refill  . butalbital-acetaminophen-caffeine (FIORICET) 50-325-40 MG tablet Take 1-2 tablets by mouth 2 (two) times daily as needed for headache. 20 tablet 0  . ibuprofen (ADVIL) 200 MG tablet Take 200 mg by mouth every 6 (six) hours as needed.    . Vitamin D, Ergocalciferol, (DRISDOL) 1.25 MG (50000 UNIT) CAPS capsule Take 1 capsule (50,000 Units total) by mouth every 7 (seven) days. 5 capsule 6   No facility-administered medications prior to visit.    No Known Allergies  ROS Review of Systems  Gastrointestinal:       Indigestion with meals      Objective:    Physical Exam  Constitutional:      Appearance: She is obese.  HENT:     Head: Normocephalic and atraumatic.  Cardiovascular:     Rate and Rhythm: Normal rate.     Pulses: Normal pulses.  Pulmonary:     Effort: Pulmonary effort is normal.  Musculoskeletal:        General: Normal range of motion.     Cervical back: Normal range of motion.  Skin:    General: Skin is warm.     Capillary Refill: Capillary refill takes less than 2 seconds.  Neurological:     General: No focal deficit present.     Mental Status: She is alert and oriented to person, place, and time.  Psychiatric:        Mood and Affect: Mood normal.        Behavior: Behavior normal.        Thought Content: Thought content normal.        Judgment: Judgment normal.    BP 129/61 (BP Location: Left Arm, Patient Position: Sitting,  Cuff Size: Normal)   Pulse 77   Temp 98.4 F (36.9 C) (Temporal)   Ht 5' 3.5" (1.613 m)   Wt 216 lb 9.6 oz (98.2 kg)   SpO2 99%   BMI 37.77 kg/m  Wt Readings from Last 3 Encounters:  08/14/20 216 lb 9.6 oz (98.2 kg)  07/24/20 212 lb (96.2 kg)  06/26/20 212 lb (96.2 kg)     Health Maintenance Due  Topic Date Due  . Hepatitis C Screening  Never done  . COVID-19 Vaccine (1) Never done  . HPV VACCINES (1 - 2-dose series) Never done  . PAP-Cervical Cytology Screening  Never done  . PAP SMEAR-Modifier  Never done       Topic Date Due  . HPV VACCINES (1 - 2-dose series) Never done    Lab Results  Component Value Date   TSH 0.684 03/17/2020   Lab Results  Component Value Date   WBC 7.3 03/17/2020   HGB 12.8 03/17/2020   HCT 39.7 03/17/2020   MCV 86 03/17/2020   PLT 361 03/17/2020   Lab Results  Component Value Date   NA 137 03/17/2020   K 4.6 03/17/2020   CO2 22 03/17/2020   GLUCOSE 86 03/17/2020   BUN 14 03/17/2020   CREATININE 0.68 03/17/2020   BILITOT 0.4 03/17/2020   ALKPHOS 72 03/17/2020   AST 16 03/17/2020   ALT 21 03/17/2020   PROT 6.7 03/17/2020   ALBUMIN 4.4 03/17/2020   CALCIUM 9.5 03/17/2020   No results found for: CHOL No results found for: HDL No results found for: LDLCALC No results found for: TRIG No results found for: CHOLHDL Lab Results  Component Value Date   HGBA1C 5.3 03/17/2020   HGBA1C 5.3 03/17/2020   HGBA1C 5.3 (A) 03/17/2020   HGBA1C 5.3 03/17/2020      Assessment & Plan:   Problem List Items Addressed This Visit   None   Visit Diagnoses    Urine frequency    -  Primary   Relevant Orders   POCT Urinalysis Dipstick (Completed)   Urine Culture   Leukocytes in urine     Empirical anbx treatment due to symptoms and history.  Urine culture pending Encourage completion of anbx even when symptoms improve.  Discussed resistance with anbx overuse Discussed allergic reactions with anbx.  Encourage increasing hydration with  water and how to tell when this is achieved Add cranberry juice 100% 8-16  ozs daily until symptoms improve Discussed hygiene Avoid not voiding when the urge presents       Meds ordered this encounter  Medications  . nitrofurantoin, macrocrystal-monohydrate, (MACROBID) 100 MG capsule    Sig: Take 1 capsule (100 mg total) by mouth 2 (two) times daily for 5 days.    Dispense:  10 capsule    Refill:  0    Order Specific Question:   Supervising Provider    Answer:   Quentin Angst [id number]  . phenazopyridine (PYRIDIUM) 100 MG tablet    Sig: Take 1 tablet (100 mg total) by mouth 3 (three) times daily as needed for pain.    Dispense:  10 tablet    Refill:  0    Order Specific Question:   Supervising Provider    Answer:   Quentin Angst [id number]    Follow-up: Return for follow up as needed.    Barbette Merino, NP

## 2020-08-14 NOTE — Patient Instructions (Signed)
Urinary Tract Infection, Adult A urinary tract infection (UTI) is an infection of any part of the urinary tract. The urinary tract includes:  The kidneys.  The ureters.  The bladder.  The urethra. These organs make, store, and get rid of pee (urine) in the body. What are the causes? This infection is caused by germs (bacteria) in your genital area. These germs grow and cause swelling (inflammation) of your urinary tract. What increases the risk? The following factors may make you more likely to develop this condition:  Using a small, thin tube (catheter) to drain pee.  Not being able to control when you pee or poop (incontinence).  Being female. If you are female, these things can increase the risk: ? Using these methods to prevent pregnancy:  A medicine that kills sperm (spermicide).  A device that blocks sperm (diaphragm). ? Having low levels of a female hormone (estrogen). ? Being pregnant. You are more likely to develop this condition if:  You have genes that add to your risk.  You are sexually active.  You take antibiotic medicines.  You have trouble peeing because of: ? A prostate that is bigger than normal, if you are female. ? A blockage in the part of your body that drains pee from the bladder. ? A kidney stone. ? A nerve condition that affects your bladder. ? Not getting enough to drink. ? Not peeing often enough.  You have other conditions, such as: ? Diabetes. ? A weak disease-fighting system (immune system). ? Sickle cell disease. ? Gout. ? Injury of the spine. What are the signs or symptoms? Symptoms of this condition include:  Needing to pee right away.  Peeing small amounts often.  Pain or burning when peeing.  Blood in the pee.  Pee that smells bad or not like normal.  Trouble peeing.  Pee that is cloudy.  Fluid coming from the vagina, if you are female.  Pain in the belly or lower back. Other symptoms include:  Vomiting.  Not  feeling hungry.  Feeling mixed up (confused). This may be the first symptom in older adults.  Being tired and grouchy (irritable).  A fever.  Watery poop (diarrhea). How is this treated?  Taking antibiotic medicine.  Taking other medicines.  Drinking enough water. In some cases, you may need to see a specialist. Follow these instructions at home: Medicines  Take over-the-counter and prescription medicines only as told by your doctor.  If you were prescribed an antibiotic medicine, take it as told by your doctor. Do not stop taking it even if you start to feel better. General instructions  Make sure you: ? Pee until your bladder is empty. ? Do not hold pee for a long time. ? Empty your bladder after sex. ? Wipe from front to back after peeing or pooping if you are a female. Use each tissue one time when you wipe.  Drink enough fluid to keep your pee pale yellow.  Keep all follow-up visits.   Contact a doctor if:  You do not get better after 1-2 days.  Your symptoms go away and then come back. Get help right away if:  You have very bad back pain.  You have very bad pain in your lower belly.  You have a fever.  You have chills.  You feeling like you will vomit or you vomit. Summary  A urinary tract infection (UTI) is an infection of any part of the urinary tract.  This condition is caused by   germs in your genital area.  There are many risk factors for a UTI.  Treatment includes antibiotic medicines.  Drink enough fluid to keep your pee pale yellow. This information is not intended to replace advice given to you by your health care provider. Make sure you discuss any questions you have with your health care provider. Document Revised: 12/06/2019 Document Reviewed: 12/06/2019 Elsevier Patient Education  2021 Elsevier Inc.  

## 2020-08-14 NOTE — Progress Notes (Signed)
Established Patient Office Visit  Subjective:  Patient ID: Caitlin Good, female    DOB: [birth year]  Age: 25 y.o. MRN: [id number]  CC: No chief complaint on file.   HPI Caitlin Good presents for UTI symptoms. Patient states she has had a UTI in the past and symptoms appear to be similar. Pt complains of burning, urgent and frequent urination accompanied by left lower abdominal and left flank pain 6/10. She denies fever,chills, nausea or vomiting. The patient has taken Tylenol 500 mg once daily and applied heat packs to abdomen and low back without resolve. She has had the same sexual partner for the last 4 years. LMP unknown. Nexplanon is used for birth control. She has recently experienced some indigestion over 3-4 days that worsens after eating. Pt states it could be related to spicy foods. She feels that the indigestion has improved with elimination of spicy and acidic foods.   Past Medical History:  Diagnosis Date  . Depression    No recent meds (used Lexapro)  . Heart palpitations   . Vitamin D deficiency 03/2020    Past Surgical History:  Procedure Laterality Date  . ADENOIDECTOMY    . TONSILLECTOMY    . VAGINAL DELIVERY     2019    Family History  Problem Relation Age of Onset  . Hypertension Mother   . Diabetes Mother   . Stroke Mother   . Depression Sister   . Polycystic ovary syndrome Sister   . Heart Problems Brother        palpitations  . Cancer Maternal Grandmother        unknown type of cancer  . Cancer Maternal Grandfather        unknown type of cancer    Social History   Socioeconomic History  . Marital status: Single    Spouse name: Not on file  . Number of children: Not on file  . Years of education: Not on file  . Highest education level: Not on file  Occupational History  . Not on file  Tobacco Use  . Smoking status: Never Smoker  . Smokeless tobacco: Never Used  Substance and Sexual Activity  . Alcohol use: No  . Drug use: No  . Sexual  activity: Yes    Birth control/protection: None  Other Topics Concern  . Not on file  Social History Narrative  . Not on file   Social Determinants of Health   Financial Resource Strain: Not on file  Food Insecurity: Not on file  Transportation Needs: Not on file  Physical Activity: Not on file  Stress: Not on file  Social Connections: Not on file  Intimate Partner Violence: Not on file    Outpatient Medications Prior to Visit  Medication Sig Dispense Refill  . butalbital-acetaminophen-caffeine (FIORICET) 50-325-40 MG tablet Take 1-2 tablets by mouth 2 (two) times daily as needed for headache. 20 tablet 0  . ibuprofen (ADVIL) 200 MG tablet Take 200 mg by mouth every 6 (six) hours as needed.    . Vitamin D, Ergocalciferol, (DRISDOL) 1.25 MG (50000 UNIT) CAPS capsule Take 1 capsule (50,000 Units total) by mouth every 7 (seven) days. 5 capsule 6   No facility-administered medications prior to visit.    No Known Allergies  ROS Review of Systems  Constitutional: Positive for unexpected weight change (weight loss related to new job). Negative for chills, fatigue and fever.  HENT: Negative.   Eyes: Negative.   Respiratory: Negative for cough, chest tightness and shortness  of breath.   Cardiovascular: Negative for chest pain, palpitations and leg swelling.  Gastrointestinal: Positive for nausea (intermittent accompanied by indigestion x 5 days 3-4 x daily around meals). Negative for abdominal pain, constipation, diarrhea and vomiting.  Genitourinary: Positive for decreased urine volume, flank pain, frequency, pelvic pain (intermittent x 2 ) and urgency. Negative for hematuria, vaginal discharge and vaginal pain.  Skin: Negative.   Allergic/Immunologic: Negative for environmental allergies and food allergies.  Neurological: Positive for headaches (Pt reports hx migraines). Negative for dizziness, weakness and numbness.  Hematological: Negative.   Psychiatric/Behavioral: Negative for  self-injury and suicidal ideas. The patient is not nervous/anxious.       Objective:    Physical Exam Constitutional:      Appearance: Normal appearance.  Cardiovascular:     Rate and Rhythm: Normal rate.     Pulses: Normal pulses.     Heart sounds: Normal heart sounds.  Abdominal:     General: Bowel sounds are normal.     Palpations: Abdomen is soft.  Neurological:     Mental Status: She is alert.     Ht 5' 3.5" (1.613 m)   Wt 216 lb 9.6 oz (98.2 kg)   BMI 37.77 kg/m  Wt Readings from Last 3 Encounters:  08/14/20 216 lb 9.6 oz (98.2 kg)  07/24/20 212 lb (96.2 kg)  06/26/20 212 lb (96.2 kg)     Health Maintenance Due  Topic Date Due  . Hepatitis C Screening  Never done  . COVID-19 Vaccine (1) Never done  . HPV VACCINES (1 - 2-dose series) Never done  . PAP-Cervical Cytology Screening  Never done  . PAP SMEAR-Modifier  Never done       Topic Date Due  . HPV VACCINES (1 - 2-dose series) Never done    Lab Results  Component Value Date   TSH 0.684 03/17/2020   Lab Results  Component Value Date   WBC 7.3 03/17/2020   HGB 12.8 03/17/2020   HCT 39.7 03/17/2020   MCV 86 03/17/2020   PLT 361 03/17/2020   Lab Results  Component Value Date   NA 137 03/17/2020   K 4.6 03/17/2020   CO2 22 03/17/2020   GLUCOSE 86 03/17/2020   BUN 14 03/17/2020   CREATININE 0.68 03/17/2020   BILITOT 0.4 03/17/2020   ALKPHOS 72 03/17/2020   AST 16 03/17/2020   ALT 21 03/17/2020   PROT 6.7 03/17/2020   ALBUMIN 4.4 03/17/2020   CALCIUM 9.5 03/17/2020   No results found for: CHOL No results found for: HDL No results found for: LDLCALC No results found for: TRIG No results found for: CHOLHDL Lab Results  Component Value Date   HGBA1C 5.3 03/17/2020   HGBA1C 5.3 03/17/2020   HGBA1C 5.3 (A) 03/17/2020   HGBA1C 5.3 03/17/2020      Assessment & Plan:  NU swab- chlamydia, gonorrhea, trichomonas UA Cx UTI- Macrobid 100 mg BID x 5 days, Pyridium 100 mg TID x 2 days Pt.  to increase water intake,Cranberry juice. Do not wait to void Void post sexual intercourse  Problem List Items Addressed This Visit   None     No orders of the defined types were placed in this encounter.   Follow-up: No follow-ups on file.    Federico Flake, RN

## 2020-08-16 LAB — NUSWAB VAGINITIS PLUS (VG+)

## 2020-08-17 ENCOUNTER — Other Ambulatory Visit: Payer: Self-pay | Admitting: Nurse Practitioner

## 2020-08-17 DIAGNOSIS — A749 Chlamydial infection, unspecified: Secondary | ICD-10-CM

## 2020-08-17 LAB — NUSWAB VAGINITIS PLUS (VG+)
Candida albicans, NAA: POSITIVE — AB
Candida glabrata, NAA: NEGATIVE
Trich vag by NAA: NEGATIVE

## 2020-08-17 MED ORDER — DOXYCYCLINE HYCLATE 100 MG PO CAPS: 100.0000 mg | ORAL_CAPSULE | Freq: Two times a day (BID) | ORAL | 0 refills | Status: AC

## 2020-08-19 ENCOUNTER — Other Ambulatory Visit: Payer: Self-pay | Admitting: Nurse Practitioner

## 2020-08-19 LAB — URINE CULTURE

## 2020-08-19 MED ORDER — FLUCONAZOLE 150 MG PO TABS: 150.0000 mg | ORAL_TABLET | Freq: Every day | ORAL | 0 refills | Status: AC

## 2020-08-20 LAB — URINE CULTURE

## 2020-12-25 ENCOUNTER — Ambulatory Visit: Payer: Medicaid Other | Admitting: Family Medicine

## 2021-01-22 ENCOUNTER — Ambulatory Visit: Payer: Medicaid Other | Admitting: Nurse Practitioner

## 2021-01-25 ENCOUNTER — Other Ambulatory Visit: Payer: Self-pay

## 2021-01-25 ENCOUNTER — Ambulatory Visit: Payer: Medicaid Other | Admitting: Nurse Practitioner

## 2021-01-25 ENCOUNTER — Ambulatory Visit (HOSPITAL_COMMUNITY)
Admission: RE | Admit: 2021-01-25 | Discharge: 2021-01-25 | Disposition: A | Discharge status: Home / Self Care | Payer: Medicaid Other | Source: Ambulatory Visit | Attending: Nurse Practitioner | Admitting: Nurse Practitioner

## 2021-01-25 ENCOUNTER — Encounter: Payer: Self-pay | Admitting: Nurse Practitioner

## 2021-01-25 VITALS — BP 126/71 | HR 63 | Temp 97.5°F | Ht 63.5 in | Wt 228.0 lb

## 2021-01-25 DIAGNOSIS — R0602 Shortness of breath: Secondary | ICD-10-CM

## 2021-01-25 DIAGNOSIS — Z0000 Encounter for general adult medical examination without abnormal findings: Secondary | ICD-10-CM | POA: Diagnosis not present

## 2021-01-25 DIAGNOSIS — Z23 Encounter for immunization: Secondary | ICD-10-CM | POA: Diagnosis not present

## 2021-01-25 DIAGNOSIS — Z3202 Encounter for pregnancy test, result negative: Secondary | ICD-10-CM

## 2021-01-25 DIAGNOSIS — N898 Other specified noninflammatory disorders of vagina: Secondary | ICD-10-CM

## 2021-01-25 DIAGNOSIS — R300 Dysuria: Secondary | ICD-10-CM | POA: Diagnosis not present

## 2021-01-25 LAB — POCT URINALYSIS DIP (CLINITEK)
Bilirubin, UA: NEGATIVE
Blood, UA: NEGATIVE
Glucose, UA: NEGATIVE mg/dL
Ketones, POC UA: NEGATIVE mg/dL
Leukocytes, UA: NEGATIVE
Nitrite, UA: NEGATIVE
POC PROTEIN,UA: NEGATIVE
Spec Grav, UA: 1.025 (ref 1.010–1.025)
Urobilinogen, UA: 0.2 E.U./dL
pH, UA: 6.5 (ref 5.0–8.0)

## 2021-01-25 LAB — POCT GLYCOSYLATED HEMOGLOBIN (HGB A1C): Hemoglobin A1C: 5.6 % (ref 4.0–5.6)

## 2021-01-25 LAB — POCT URINE PREGNANCY: Preg Test, Ur: NEGATIVE

## 2021-01-25 MED ORDER — ALBUTEROL SULFATE HFA 108 (90 BASE) MCG/ACT IN AERS: 2.0000 | INHALATION_SPRAY | Freq: Four times a day (QID) | RESPIRATORY_TRACT | 2 refills | Status: DC | PRN

## 2021-01-25 NOTE — Progress Notes (Signed)
Caitlin Good Patient Caitlin Good [address] [place], [place]  [address] Phone:  [phone]   Fax:  [phone] Subjective:   Patient ID: Caitlin Good, female    DOB: Sep 16, 1995, 25 y.o.   MRN: [id number]  Chief Complaint  Patient presents with   Follow-up    Short of breath, past week. Feels like she is gasping for air every 30 minutes started only when laying down now it happens no matter what she is doing.   HPI Caitlin Good 25 y.o. female presents with history of depression, heart palpitations and vitamin D deficiency to the Caitlin Good for pelvic pain and shortness of breath. States that symptoms began 1 wk ago.  Shortness of breath occurs periodically throughout the day with activity and at rest. Denies having similar symptoms in the past. Denies any changes in diet or habits. Not currently taking medications. Denies any recent trauma or injury. No numbness, tingling or changes in vision.  Patient also complaining of lower abdominal pain x 2 wks. Endorses periodic vomiting, with no nausea or diarrhea. Rates pain 7/10, describes as cramping/ sharp. Denies any improving factors. Has taken tylenol for symptoms with no improvement. Pain worsens when sitting and relaxed. Also worsens with urination. Last BM prior to visit, normal. Endorses some vaginal discharge, white, with no odor. States that she has had one partner in the past 6 mths, unprotected.  Patient has been unemployed x 2 mths, resigned from previous position. Currently cares for 27 yr old daughter full time, lives with boyfriend. LMP 3 wks ago, spotting. Currently on Nexplanon, states that it ran out on September 3rd, unable to get replaced by Ob/Gyn until October.    Past Medical History:  Diagnosis Date   Depression    No recent meds (used Lexapro)   Heart palpitations    Vitamin D deficiency 03/2020    Past Surgical History:  Procedure Laterality Date   ADENOIDECTOMY     TONSILLECTOMY     VAGINAL DELIVERY      2019    Family History  Problem Relation Age of Onset   Hypertension Mother    Diabetes Mother    Stroke Mother    Depression Sister    Polycystic ovary syndrome Sister    Heart Problems Brother        palpitations   Cancer Maternal Grandmother        unknown type of cancer   Cancer Maternal Grandfather        unknown type of cancer    Social History   Socioeconomic History   Marital status: Single    Spouse name: Not on file   Number of children: Not on file   Years of education: Not on file   Highest education level: Not on file  Occupational History   Not on file  Tobacco Use   Smoking status: Never   Smokeless tobacco: Never  Substance and Sexual Activity   Alcohol use: No   Drug use: No   Sexual activity: Yes    Birth control/protection: None  Other Topics Concern   Not on file  Social History Narrative   Not on file   Social Determinants of Good   Financial Resource Strain: Not on file  Food Insecurity: Not on file  Transportation Needs: Not on file  Physical Activity: Not on file  Stress: Not on file  Social Connections: Not on file  Intimate Partner Violence: Not on file    Outpatient Medications Prior to  Visit  Medication Sig Dispense Refill   Vitamin D, Ergocalciferol, (DRISDOL) 1.25 MG (50000 UNIT) CAPS capsule Take 1 capsule (50,000 Units total) by mouth every 7 (seven) days. 5 capsule 6   butalbital-acetaminophen-caffeine (FIORICET) 50-325-40 MG tablet Take 1-2 tablets by mouth 2 (two) times daily as needed for headache. (Patient not taking: Reported on 01/25/2021) 20 tablet 0   etonogestrel (NEXPLANON) 68 MG IMPL implant Nexplanon 68 mg subdermal implant  Inject 1 implant every day by subcutaneous route.     ibuprofen (ADVIL) 200 MG tablet Take 200 mg by mouth every 6 (six) hours as needed. (Patient not taking: Reported on 01/25/2021)     phenazopyridine (PYRIDIUM) 100 MG tablet Take 1 tablet (100 mg total) by mouth 3 (three) times daily as  needed for pain. 10 tablet 0   No facility-administered medications prior to visit.    No Known Allergies  Review of Systems  Constitutional:  Negative for chills, fever and malaise/fatigue.  Respiratory:  Positive for shortness of breath. Negative for cough.   Cardiovascular:  Positive for chest pain. Negative for palpitations and leg swelling.  Gastrointestinal:  Positive for abdominal pain. Negative for blood in stool, constipation, diarrhea, nausea and vomiting.  Genitourinary:  Positive for dysuria. Negative for flank pain, frequency, hematuria and urgency.       Vaginal discharge  Musculoskeletal: Negative.   Skin: Negative.   Neurological: Negative.   Psychiatric/Behavioral:  Negative for depression. The patient is not nervous/anxious.   All other systems reviewed and are negative.     Objective:    Physical Exam Constitutional:      General: She is not in acute distress.    Appearance: Normal appearance.  HENT:     Head: Normocephalic.     Right Ear: Tympanic membrane, ear canal and external ear normal.     Left Ear: Tympanic membrane, ear canal and external ear normal.     Nose: Nose normal.     Mouth/Throat:     Mouth: Mucous membranes are moist.  Cardiovascular:     Rate and Rhythm: Normal rate and regular rhythm.     Pulses: Normal pulses.     Heart sounds: Normal heart sounds.     Comments: No obvious peripheral edema Pulmonary:     Effort: Pulmonary effort is normal.     Breath sounds: Normal breath sounds.  Abdominal:     General: Abdomen is flat. Bowel sounds are normal. There is no distension.     Palpations: Abdomen is soft. There is no mass.     Tenderness: There is no right CVA tenderness, left CVA tenderness or guarding.     Comments: Mild tenderness with palpation of diffuse pelvic area. Diffuse abdomen non tender to palpation  Genitourinary:    Comments: deferred Musculoskeletal:        General: Normal range of motion.     Cervical back:  Normal range of motion and neck supple.  Skin:    General: Skin is warm and dry.     Capillary Refill: Capillary refill takes less than 2 seconds.  Neurological:     General: No focal deficit present.     Mental Status: She is alert and oriented to person, place, and time.  Psychiatric:        Mood and Affect: Mood normal.        Behavior: Behavior normal.        Thought Content: Thought content normal.        Judgment:  Judgment normal.    BP 126/71   Pulse 63   Temp (!) 97.5 F (36.4 C)   Ht 5' 3.5" (1.613 m)   Wt 228 lb (103.4 kg)   SpO2 100%   Breastfeeding No   BMI 39.75 kg/m  Wt Readings from Last 3 Encounters:  01/25/21 228 lb (103.4 kg)  08/14/20 216 lb 9.6 oz (98.2 kg)  07/24/20 212 lb (96.2 kg)    Immunization History  Administered Date(s) Administered   Influenza,inj,Quad PF,6+ Mos 01/25/2021   Influenza-Unspecified 02/11/2020   PFIZER(Purple Top)SARS-COV-2 Vaccination 10/16/2019, 11/20/2019    Diabetic Foot Exam - Simple   No data filed     Lab Results  Component Value Date   TSH 0.684 03/17/2020   Lab Results  Component Value Date   WBC 7.3 03/17/2020   HGB 12.8 03/17/2020   HCT 39.7 03/17/2020   MCV 86 03/17/2020   PLT 361 03/17/2020   Lab Results  Component Value Date   NA 137 03/17/2020   K 4.6 03/17/2020   CO2 22 03/17/2020   GLUCOSE 86 03/17/2020   BUN 14 03/17/2020   CREATININE 0.68 03/17/2020   BILITOT 0.4 03/17/2020   ALKPHOS 72 03/17/2020   AST 16 03/17/2020   ALT 21 03/17/2020   PROT 6.7 03/17/2020   ALBUMIN 4.4 03/17/2020   CALCIUM 9.5 03/17/2020   No results found for: CHOL No results found for: HDL No results found for: LDLCALC No results found for: TRIG No results found for: CHOLHDL Lab Results  Component Value Date   HGBA1C 5.6 01/25/2021   HGBA1C 5.3 03/17/2020   HGBA1C 5.3 03/17/2020   HGBA1C 5.3 (A) 03/17/2020   HGBA1C 5.3 03/17/2020       Assessment & Plan:   Problem List Items Addressed This Visit    None Visit Diagnoses     Pregnancy examination or test, negative result    -  Primary   Relevant Orders   POCT urine pregnancy (Completed)   Healthcare maintenance       Relevant Orders   CBC with Differential/Platelet   Comprehensive metabolic panel   Lipid panel   POCT glycosylated hemoglobin (Hb A1C) (Completed)   Flu Vaccine QUAD 54mo+IM (Fluarix, Fluzone & Alfiuria Quad PF) (Completed)   HIV antibody (with reflex)   Vitamin D, 25-hydroxy   Dysuria       Relevant Orders   POCT URINALYSIS DIP (CLINITEK) (Completed)   Vaginal discharge       Relevant Orders   NuSwab BV and Candida, NAA   GC/Chlamydia Probe Amp([facility])   Shortness of breath       Relevant Orders   EKG 12-Lead: EKG: normal EKG, normal sinus rhythm.    DG Chest 2 View   D-dimer, quantitative Albuterol inhaler ordered to be used as needed  Concerned for PE v infectious process v cardiac etiology v anemia v other electrolyte imbalance v anxiety/ psychosocial etiology     Abdominal Pain       Concerned for GU infection v other infectious process v musculoskeletal etiology       Labs ordered       Patient informed to take OTC medications as needed for symptoms     Follow up in 1 mth for pap smear, sooner as needed    I am having Lelon Frohlich maintain her Vitamin D (Ergocalciferol), ibuprofen, butalbital-acetaminophen-caffeine, phenazopyridine, and Nexplanon.  No orders of the defined types were placed in this encounter.    Enid Derry I  Fleeta Emmer, NP

## 2021-01-25 NOTE — Patient Instructions (Signed)
You were seen today in the [facility] for shortness of breath and pelvic pain. Labs were collected, results will be available via MyChart or, if abnormal, you will be contacted by clinic staff. You were prescribed medications, please take as directed. Please follow up in 1 mth for reevaluation of symptoms and pap smear.

## 2021-01-26 ENCOUNTER — Other Ambulatory Visit: Payer: Self-pay | Admitting: Nurse Practitioner

## 2021-01-26 DIAGNOSIS — E559 Vitamin D deficiency, unspecified: Secondary | ICD-10-CM

## 2021-01-26 LAB — LIPID PANEL
Chol/HDL Ratio: 3 ratio (ref 0.0–4.4)
Cholesterol, Total: 145 mg/dL (ref 100–199)
HDL: 49 mg/dL (ref >39)
LDL Chol Calc (NIH): 84 mg/dL (ref 0–99)
Triglycerides: 58 mg/dL (ref 0–149)
VLDL Cholesterol Cal: 12 mg/dL (ref 5–40)

## 2021-01-26 LAB — COMPREHENSIVE METABOLIC PANEL
ALT: 25 IU/L (ref 0–32)
AST: 17 IU/L (ref 0–40)
Albumin/Globulin Ratio: 1.7 (ref 1.2–2.2)
Albumin: 4.3 g/dL (ref 3.9–5.0)
Alkaline Phosphatase: 59 IU/L (ref 44–121)
BUN/Creatinine Ratio: 14 (ref 9–23)
BUN: 10 mg/dL (ref 6–20)
Bilirubin Total: <0.2 mg/dL (ref 0.0–1.2)
CO2: 23 mmol/L (ref 20–29)
Calcium: 9.2 mg/dL (ref 8.7–10.2)
Chloride: 107 mmol/L — ABNORMAL HIGH (ref 96–106)
Creatinine, Ser: 0.7 mg/dL (ref 0.57–1.00)
Globulin, Total: 2.5 g/dL (ref 1.5–4.5)
Glucose: 94 mg/dL (ref 65–99)
Potassium: 4.5 mmol/L (ref 3.5–5.2)
Sodium: 143 mmol/L (ref 134–144)
Total Protein: 6.8 g/dL (ref 6.0–8.5)
eGFR: 123 mL/min/{1.73_m2} (ref >59)

## 2021-01-26 LAB — CBC WITH DIFFERENTIAL/PLATELET
Basophils Absolute: 0 10*3/uL (ref 0.0–0.2)
Basos: 1 %
EOS (ABSOLUTE): 0.2 10*3/uL (ref 0.0–0.4)
Eos: 2 %
Hematocrit: 36.6 % (ref 34.0–46.6)
Hemoglobin: 12.4 g/dL (ref 11.1–15.9)
Immature Grans (Abs): 0 10*3/uL (ref 0.0–0.1)
Immature Granulocytes: 1 %
Lymphocytes Absolute: 3.4 10*3/uL — ABNORMAL HIGH (ref 0.7–3.1)
Lymphs: 40 %
MCH: 27.7 pg (ref 26.6–33.0)
MCHC: 33.9 g/dL (ref 31.5–35.7)
MCV: 82 fL (ref 79–97)
Monocytes Absolute: 0.6 10*3/uL (ref 0.1–0.9)
Monocytes: 7 %
Neutrophils Absolute: 4.2 10*3/uL (ref 1.4–7.0)
Neutrophils: 49 %
Platelets: 337 10*3/uL (ref 150–450)
RBC: 4.48 x10E6/uL (ref 3.77–5.28)
RDW: 13.2 % (ref 11.7–15.4)
WBC: 8.4 10*3/uL (ref 3.4–10.8)

## 2021-01-26 LAB — VITAMIN D 25 HYDROXY (VIT D DEFICIENCY, FRACTURES): Vit D, 25-Hydroxy: 21.5 ng/mL — ABNORMAL LOW (ref 30.0–100.0)

## 2021-01-26 LAB — D-DIMER, QUANTITATIVE: D-DIMER: 0.37 mg/L FEU (ref 0.00–0.49)

## 2021-01-26 LAB — HIV ANTIBODY (ROUTINE TESTING W REFLEX): HIV Screen 4th Generation wRfx: NONREACTIVE

## 2021-01-26 MED ORDER — VITAMIN D (ERGOCALCIFEROL) 1.25 MG (50000 UNIT) PO CAPS: 50000.0000 [IU] | ORAL_CAPSULE | ORAL | 0 refills | Status: AC

## 2021-01-28 LAB — GC/CHLAMYDIA PROBE AMP
Chlamydia trachomatis, NAA: NEGATIVE
Neisseria Gonorrhoeae by PCR: NEGATIVE

## 2021-01-29 ENCOUNTER — Other Ambulatory Visit: Payer: Self-pay | Admitting: Nurse Practitioner

## 2021-01-29 DIAGNOSIS — B9689 Other specified bacterial agents as the cause of diseases classified elsewhere: Secondary | ICD-10-CM

## 2021-01-29 MED ORDER — METRONIDAZOLE 500 MG PO TABS: 500.0000 mg | ORAL_TABLET | Freq: Two times a day (BID) | ORAL | 0 refills | Status: AC

## 2021-02-02 LAB — NUSWAB BV AND CANDIDA, NAA
Atopobium vaginae: HIGH Score — AB
Candida albicans, NAA: NEGATIVE
Candida glabrata, NAA: NEGATIVE

## 2021-02-24 ENCOUNTER — Encounter: Payer: Medicaid Other | Admitting: Nurse Practitioner

## 2021-03-03 ENCOUNTER — Ambulatory Visit (INDEPENDENT_AMBULATORY_CARE_PROVIDER_SITE_OTHER): Payer: Medicaid Other | Admitting: Nurse Practitioner

## 2021-03-03 ENCOUNTER — Encounter: Payer: Self-pay | Admitting: Nurse Practitioner

## 2021-03-03 ENCOUNTER — Other Ambulatory Visit: Payer: Self-pay

## 2021-03-03 VITALS — BP 115/69 | HR 67 | Temp 98.1°F | Ht 63.5 in | Wt 227.0 lb

## 2021-03-03 DIAGNOSIS — R519 Headache, unspecified: Secondary | ICD-10-CM | POA: Diagnosis not present

## 2021-03-03 DIAGNOSIS — G8929 Other chronic pain: Secondary | ICD-10-CM

## 2021-03-03 DIAGNOSIS — G43909 Migraine, unspecified, not intractable, without status migrainosus: Secondary | ICD-10-CM | POA: Insufficient documentation

## 2021-03-03 DIAGNOSIS — Z01419 Encounter for gynecological examination (general) (routine) without abnormal findings: Secondary | ICD-10-CM

## 2021-03-03 DIAGNOSIS — F32A Depression, unspecified: Secondary | ICD-10-CM | POA: Insufficient documentation

## 2021-03-03 MED ORDER — BUTALBITAL-APAP-CAFFEINE 50-325-40 MG PO TABS: 1.0000 | ORAL_TABLET | Freq: Two times a day (BID) | ORAL | 0 refills | Status: AC | PRN

## 2021-03-03 NOTE — Patient Instructions (Signed)
You were seen today in the [facility] for women's annual wellness exam. Labs were collected, results will be available via MyChart or, if abnormal, you will be contacted by clinic staff. You were prescribed medications, please take as directed. Please follow up in 1 yr for annual wellness exam. °

## 2021-03-03 NOTE — Progress Notes (Signed)
Caitlin Good, [place]  [address] Phone:  [phone]   Fax:  [phone] Subjective:   Patient ID: Caitlin Good, female    DOB: 1995/08/17, 25 y.o.   MRN: [id number]  Chief Complaint  Patient presents with   Gynecologic Exam    Caitlin Good 25 y.o. female  has a past medical history of Depression, Heart palpitations, and Vitamin D deficiency (03/2020). To [facility] for Women's Annual Wellness Exam and reevaluation of headache. Patient states that Fioricet was effective in managing HA, requesting refill. Denies any other complaints today. Denies any vaginal discomfort or discharge. Denies any breast abnormalities and/ symptoms. Denies completing SBE at home. Family history of cervical cancer in grandmother.   Denies any fatigue, chest pain, shortness of breath, HA or dizziness. Denies any blurred vision, numbness or tingling. Denies any abdominal pain, nausea/ vomiting or diarrhea.   Past Medical History:  Diagnosis Date   Depression    No recent meds (used Lexapro)   Heart palpitations    Vitamin D deficiency 03/2020    Past Surgical History:  Procedure Laterality Date   ADENOIDECTOMY     TONSILLECTOMY     VAGINAL DELIVERY     2019    Family History  Problem Relation Age of Onset   Hypertension Mother    Diabetes Mother    Stroke Mother    Depression Sister    Polycystic ovary syndrome Sister    Heart Problems Brother        palpitations   Cancer Maternal Grandmother        unknown type of cancer   Cancer Maternal Grandfather        unknown type of cancer    Social History   Socioeconomic History   Marital status: Single    Spouse name: Not on file   Number of children: Not on file   Years of education: Not on file   Highest education level: Not on file  Occupational History   Not on file  Tobacco Use   Smoking status: Never   Smokeless tobacco: Never  Substance and Sexual Activity   Alcohol use: No   Drug use:  No   Sexual activity: Yes    Birth control/protection: None  Other Topics Concern   Not on file  Social History Narrative   Not on file   Social Determinants of Health   Financial Resource Strain: Not on file  Food Insecurity: Not on file  Transportation Needs: Not on file  Physical Activity: Not on file  Stress: Not on file  Social Connections: Not on file  Intimate Partner Violence: Not on file    Outpatient Medications Prior to Visit  Medication Sig Dispense Refill   albuterol (VENTOLIN HFA) 108 (90 Base) MCG/ACT inhaler Inhale 2 puffs into the lungs every 6 (six) hours as needed for wheezing or shortness of breath. 8 g 2   Vitamin D, Ergocalciferol, (DRISDOL) 1.25 MG (50000 UNIT) CAPS capsule Take 1 capsule (50,000 Units total) by mouth every 7 (seven) days for 8 doses. 8 capsule 0   butalbital-acetaminophen-caffeine (FIORICET) 50-325-40 MG tablet Take 1-2 tablets by mouth 2 (two) times daily as needed for headache. 20 tablet 0   etonogestrel (NEXPLANON) 68 MG IMPL implant Nexplanon 68 mg subdermal implant  Inject 1 implant every day by subcutaneous route.     ibuprofen (ADVIL) 200 MG tablet Take 200 mg by mouth every 6 (six) hours as needed. (Patient not  taking: Reported on 01/25/2021)     phenazopyridine (PYRIDIUM) 100 MG tablet Take 1 tablet (100 mg total) by mouth 3 (three) times daily as needed for pain. 10 tablet 0   No facility-administered medications prior to visit.    No Known Allergies  Review of Systems  Constitutional:  Negative for chills, fever and malaise/fatigue.  Respiratory:  Negative for cough and shortness of breath.   Cardiovascular:  Negative for chest pain, palpitations and leg swelling.  Gastrointestinal:  Negative for abdominal pain, blood in stool, constipation, diarrhea, nausea and vomiting.  Genitourinary: Negative.   Musculoskeletal: Negative.   Skin: Negative.   Neurological:  Positive for headaches.  Psychiatric/Behavioral:  Negative for  depression. The patient is not nervous/anxious.   All other systems reviewed and are negative.     Objective:    Physical Exam Vitals reviewed. Exam conducted with a chaperone present.  Constitutional:      General: She is not in acute distress.    Appearance: Normal appearance.  HENT:     Head: Normocephalic.  Cardiovascular:     Rate and Rhythm: Normal rate and regular rhythm.     Pulses: Normal pulses.     Heart sounds: Normal heart sounds.     Comments: No obvious peripheral edema Pulmonary:     Effort: Pulmonary effort is normal.     Breath sounds: Normal breath sounds.  Chest:  Breasts:    Right: Absent. No swelling, bleeding, inverted nipple, mass, nipple discharge, skin change or tenderness.     Left: Absent. No swelling, bleeding, inverted nipple, mass, nipple discharge, skin change or tenderness.  Genitourinary:    General: Normal vulva.     Vagina: Vaginal discharge present. No erythema, tenderness, bleeding or lesions.     Cervix: Lesion present. No cervical motion tenderness, erythema or cervical bleeding.     Comments: Small amount of thick white vaginal discharge noted Skin:    General: Skin is warm and dry.     Capillary Refill: Capillary refill takes less than 2 seconds.  Neurological:     General: No focal deficit present.     Mental Status: She is alert and oriented to person, place, and time.  Psychiatric:        Mood and Affect: Mood normal.        Behavior: Behavior normal.        Thought Content: Thought content normal.        Judgment: Judgment normal.    BP 115/69   Pulse 67   Temp 98.1 F (36.7 C)   Ht 5' 3.5" (1.613 m)   Wt 227 lb (103 kg)   LMP 12/11/2020   SpO2 100%   BMI 39.58 kg/m  Wt Readings from Last 3 Encounters:  03/03/21 227 lb (103 kg)  01/25/21 228 lb (103.4 kg)  08/14/20 216 lb 9.6 oz (98.2 kg)    Immunization History  Administered Date(s) Administered   DTaP 08/28/1995, 10/30/1995, 07/01/1996, 04/23/1998, 01/11/2001    HPV 9-valent 12/10/2007, 02/13/2008, 06/27/2008   Hepatitis B, adult January 14, 1996, 07/27/1995, 07/01/1996   HiB (PRP-OMP) 08/28/1995, 10/30/1995, 07/01/1996, 04/23/1998   IPV 08/28/1995, 10/30/1995, 04/23/1998, 01/11/2001   Influenza,inj,Quad PF,6+ Mos 04/25/2016, 01/25/2021   Influenza-Unspecified 02/11/2020   MMR 04/23/1998, 01/11/2001   Meningococcal Polysaccharide 12/10/2007   PFIZER(Purple Top)SARS-COV-2 Vaccination 10/16/2019, 11/20/2019   Tdap 12/10/2007, 08/19/2012, 07/27/2017   Varicella 07/15/1996    Diabetic Foot Exam - Simple   No data filed     Lab Results  Component Value Date   TSH 0.684 03/17/2020   Lab Results  Component Value Date   WBC 8.4 01/25/2021   HGB 12.4 01/25/2021   HCT 36.6 01/25/2021   MCV 82 01/25/2021   PLT 337 01/25/2021   Lab Results  Component Value Date   NA 143 01/25/2021   K 4.5 01/25/2021   CO2 23 01/25/2021   GLUCOSE 94 01/25/2021   BUN 10 01/25/2021   CREATININE 0.70 01/25/2021   BILITOT <0.2 01/25/2021   ALKPHOS 59 01/25/2021   AST 17 01/25/2021   ALT 25 01/25/2021   PROT 6.8 01/25/2021   ALBUMIN 4.3 01/25/2021   CALCIUM 9.2 01/25/2021   EGFR 123 01/25/2021   Lab Results  Component Value Date   CHOL 145 01/25/2021   Lab Results  Component Value Date   HDL 49 01/25/2021   Lab Results  Component Value Date   LDLCALC 84 01/25/2021   Lab Results  Component Value Date   TRIG 58 01/25/2021   Lab Results  Component Value Date   CHOLHDL 3.0 01/25/2021   Lab Results  Component Value Date   HGBA1C 5.6 01/25/2021   HGBA1C 5.3 03/17/2020   HGBA1C 5.3 03/17/2020   HGBA1C 5.3 (A) 03/17/2020   HGBA1C 5.3 03/17/2020       Assessment & Plan:   Problem List Items Addressed This Visit   None Visit Diagnoses     Women's annual routine gynecological examination    -  Primary   Relevant Orders   Pap IG and Chlamydia/Gonococcus, NAA ([facility]) Educated on [employer] and provided Scientist, clinical (histocompatibility and immunogenetics)  Discussed  safe sex practices BV/Yeast/Trich deferred, patient completed at previous visit   Chronic nonintractable headache, unspecified headache type       Relevant Medications   butalbital-acetaminophen-caffeine (FIORICET) 50-325-40 MG tablet Medication refill completed   Follow up in 1 yr for annual wellness visit, sooner as needed    I have discontinued Caitlin Good's ibuprofen, phenazopyridine, and Nexplanon. I am also having her maintain her albuterol, Vitamin D (Ergocalciferol), and butalbital-acetaminophen-caffeine.  Meds ordered this encounter  Medications   butalbital-acetaminophen-caffeine (FIORICET) 50-325-40 MG tablet    Sig: Take 1-2 tablets by mouth 2 (two) times daily as needed for headache.    Dispense:  20 tablet    Refill:  0     Teena Dunk, NP

## 2021-03-08 LAB — PAP IG AND CT-NG NAA
Chlamydia, Nuc. Acid Amp: NEGATIVE
Gonococcus by Nucleic Acid Amp: NEGATIVE

## 2021-07-07 ENCOUNTER — Encounter: Payer: Self-pay | Admitting: Nurse Practitioner

## 2021-07-07 ENCOUNTER — Telehealth (INDEPENDENT_AMBULATORY_CARE_PROVIDER_SITE_OTHER): Payer: Medicaid Other | Admitting: Nurse Practitioner

## 2021-07-07 ENCOUNTER — Other Ambulatory Visit: Payer: Self-pay

## 2021-07-07 DIAGNOSIS — R0602 Shortness of breath: Secondary | ICD-10-CM

## 2021-07-07 DIAGNOSIS — R058 Other specified cough: Secondary | ICD-10-CM | POA: Diagnosis not present

## 2021-07-07 DIAGNOSIS — J069 Acute upper respiratory infection, unspecified: Secondary | ICD-10-CM | POA: Diagnosis not present

## 2021-07-07 MED ORDER — AZITHROMYCIN 250 MG PO TABS: ORAL_TABLET | ORAL | 0 refills | Status: AC

## 2021-07-07 MED ORDER — ALBUTEROL SULFATE HFA 108 (90 BASE) MCG/ACT IN AERS: 2.0000 | INHALATION_SPRAY | Freq: Four times a day (QID) | RESPIRATORY_TRACT | 2 refills | Status: AC | PRN

## 2021-07-07 MED ORDER — BENZONATATE 200 MG PO CAPS: 200.0000 mg | ORAL_CAPSULE | Freq: Two times a day (BID) | ORAL | 0 refills | Status: AC | PRN

## 2021-07-07 NOTE — Progress Notes (Signed)
Virtual Visit via Telephone Note ? ?I connected with Caitlin Good on 07/07/21 at 10:00 AM EST by telephone and verified that I am speaking with the correct person using two identifiers. ?  ?I discussed the limitations, risks, security and privacy concerns of performing an evaluation and management service by telephone and the availability of in person appointments. I also discussed with the patient that there may be a patient responsible charge related to this service. The patient expressed understanding and agreed to proceed. ? ?Patient home ?Provider Office ? ?History of Present Illness: ? ?Caitlin Good  has a past medical history of Depression, Heart palpitations, and Vitamin D deficiency (03/2020). States that she is concerned for mold exposure. States that she was staying with her mother for 3 yrs and moved 7 mths ago. Mother at confirmed mold in the home. Also concerned about productive cough with yellow mucus that has occurred twice in the past 3 mths. Sputum is yellow and brown in color. Denies any sick contacts. Denies any fever. Has had some chest pain and shortness of breath with activity and at rest. Symptoms resolved and reoccurred two wks ago. Has been taking dayquil and mucinex with no improvement in symptoms. Denies any other complaints today.  ? ?  ?Review of Systems  ?Constitutional:  Negative for chills, fever and malaise/fatigue.  ?HENT: Negative.    ?Eyes: Negative.   ?Respiratory:  Positive for cough, sputum production and shortness of breath. Negative for hemoptysis and wheezing.   ?Cardiovascular:  Positive for chest pain. Negative for palpitations and leg swelling.  ?Gastrointestinal:  Negative for abdominal pain, blood in stool, constipation, diarrhea, nausea and vomiting.  ?Skin: Negative.   ?Neurological: Negative.   ?Psychiatric/Behavioral:  Negative for depression. The patient is not nervous/anxious.   ?All other systems reviewed and are negative.  ? ?Observations/Objective: ?No exam;  telephone visit ? ?Assessment and Plan: ?1. Upper respiratory tract infection, unspecified type ?- azithromycin (ZITHROMAX Z-PAK) 250 MG tablet; Take two tabs the first day and one tab daily for every consecutive day  Dispense: 6 each; Refill: 0 ?- albuterol (VENTOLIN HFA) 108 (90 Base) MCG/ACT inhaler; Inhale 2 puffs into the lungs every 6 (six) hours as needed for wheezing or shortness of breath.  Dispense: 8 g; Refill: 2 ?- benzonatate (TESSALON) 200 MG capsule; Take 1 capsule (200 mg total) by mouth 2 (two) times daily as needed for cough.  Dispense: 20 capsule; Refill: 0 ?-Informed to continue taking OTC medications as needed for symptoms.  ? ?2. Productive cough ?- azithromycin (ZITHROMAX Z-PAK) 250 MG tablet; Take two tabs the first day and one tab daily for every consecutive day  Dispense: 6 each; Refill: 0 ?- albuterol (VENTOLIN HFA) 108 (90 Base) MCG/ACT inhaler; Inhale 2 puffs into the lungs every 6 (six) hours as needed for wheezing or shortness of breath.  Dispense: 8 g; Refill: 2 ?- benzonatate (TESSALON) 200 MG capsule; Take 1 capsule (200 mg total) by mouth 2 (two) times daily as needed for cough.  Dispense: 20 capsule; Refill: 0 ? ?3. Shortness of breath ?- azithromycin (ZITHROMAX Z-PAK) 250 MG tablet; Take two tabs the first day and one tab daily for every consecutive day  Dispense: 6 each; Refill: 0 ?- albuterol (VENTOLIN HFA) 108 (90 Base) MCG/ACT inhaler; Inhale 2 puffs into the lungs every 6 (six) hours as needed for wheezing or shortness of breath.  Dispense: 8 g; Refill: 2 ?- benzonatate (TESSALON) 200 MG capsule; Take 1 capsule (200 mg total) by mouth  2 (two) times daily as needed for cough.  Dispense: 20 capsule; Refill: 0 ? ? ? ?Follow Up Instructions: ? ?Follow up in 1-2 wks if symptoms worsen or do not improve, sooner as needed ?  ?I discussed the assessment and treatment plan with the patient. The patient was provided an opportunity to ask questions and all were answered. The patient  agreed with the plan and demonstrated an understanding of the instructions. ?  ?The patient was advised to call back or seek an in-person evaluation if the symptoms worsen or if the condition fails to improve as anticipated. ? ?I provided 20 minutes of telephone- visit time during this encounter. ? ? ?Kathrynn Speed, NP ?

## 2022-03-04 ENCOUNTER — Ambulatory Visit: Payer: Medicaid Other | Admitting: Nurse Practitioner

## 2022-05-12 ENCOUNTER — Telehealth: Payer: Self-pay

## 2022-05-12 NOTE — Telephone Encounter (Signed)
Transition Care Management Follow-up Telephone Call Date of discharge and from where: 05/06/22 How have you been since you were released from the hospital? Per pt she has been good  Any questions or concerns? No  Items Reviewed: Did the pt receive and understand the discharge instructions provided? Yes  Medications obtained and verified? Yes  Other? No  Any new allergies since your discharge? No  Dietary orders reviewed? No Do you have support at home? Yes    Follow up appointments reviewed:  PCP Hospital f/u appt confirmed? Yes  Scheduled to see Farrel Gobble on 05/20/22 @ 11:20 am. [facility] f/u appt confirmed? No  Are transportation arrangements needed? No  If their condition worsens, is the pt aware to call PCP or go to the Emergency Dept.? Yes Was the patient provided with contact information for the PCP's office or ED? Yes Was to pt encouraged to call back with questions or concerns? Yes    Elyse Jarvis RMA

## 2022-05-20 ENCOUNTER — Inpatient Hospital Stay: Payer: Self-pay | Admitting: Nurse Practitioner

## 2022-08-03 IMAGING — DX DG CHEST 2V
2 series · 2 of 2 positions shown · non-contrast
Comparison: None.

CLINICAL DATA: Shortness of breath and chest pain

EXAM:
CHEST - 2 VIEW

[chest pa]
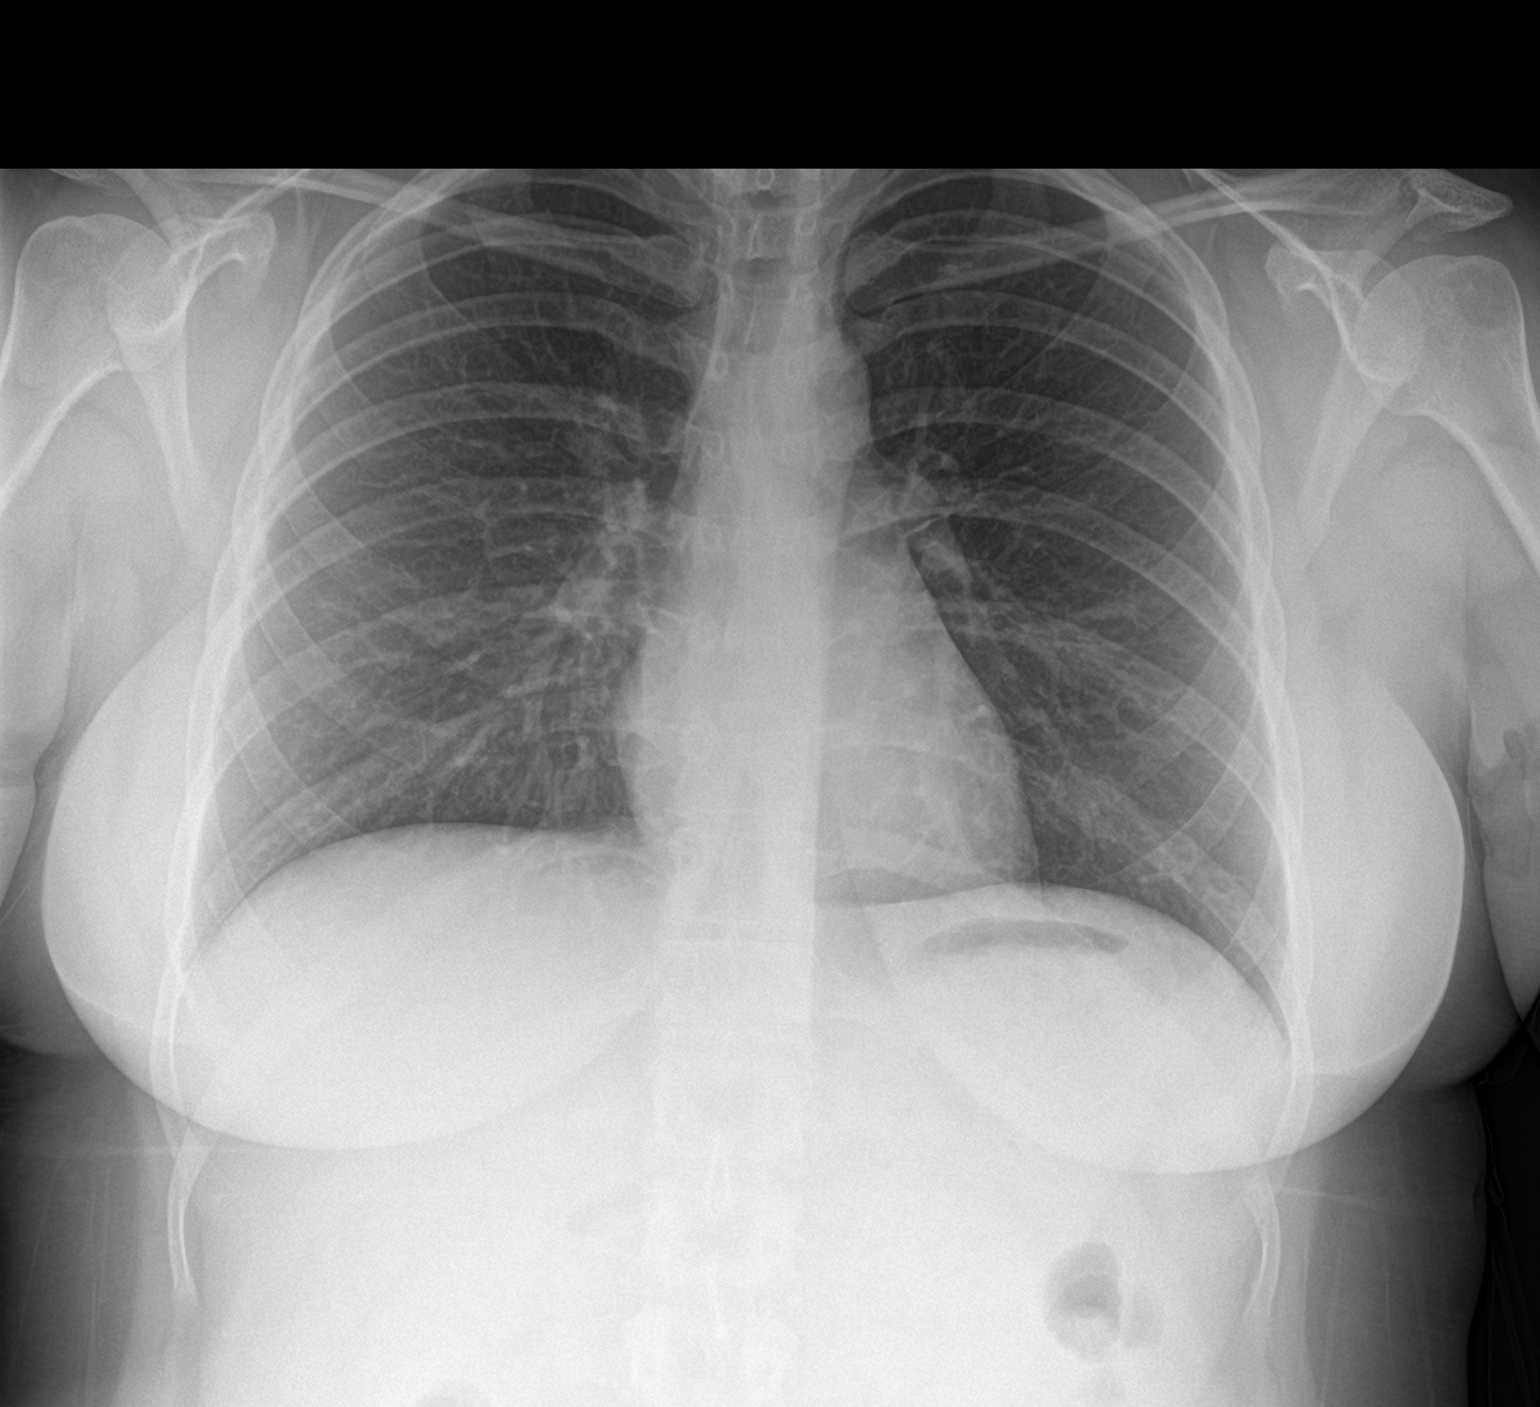

[chest lat]
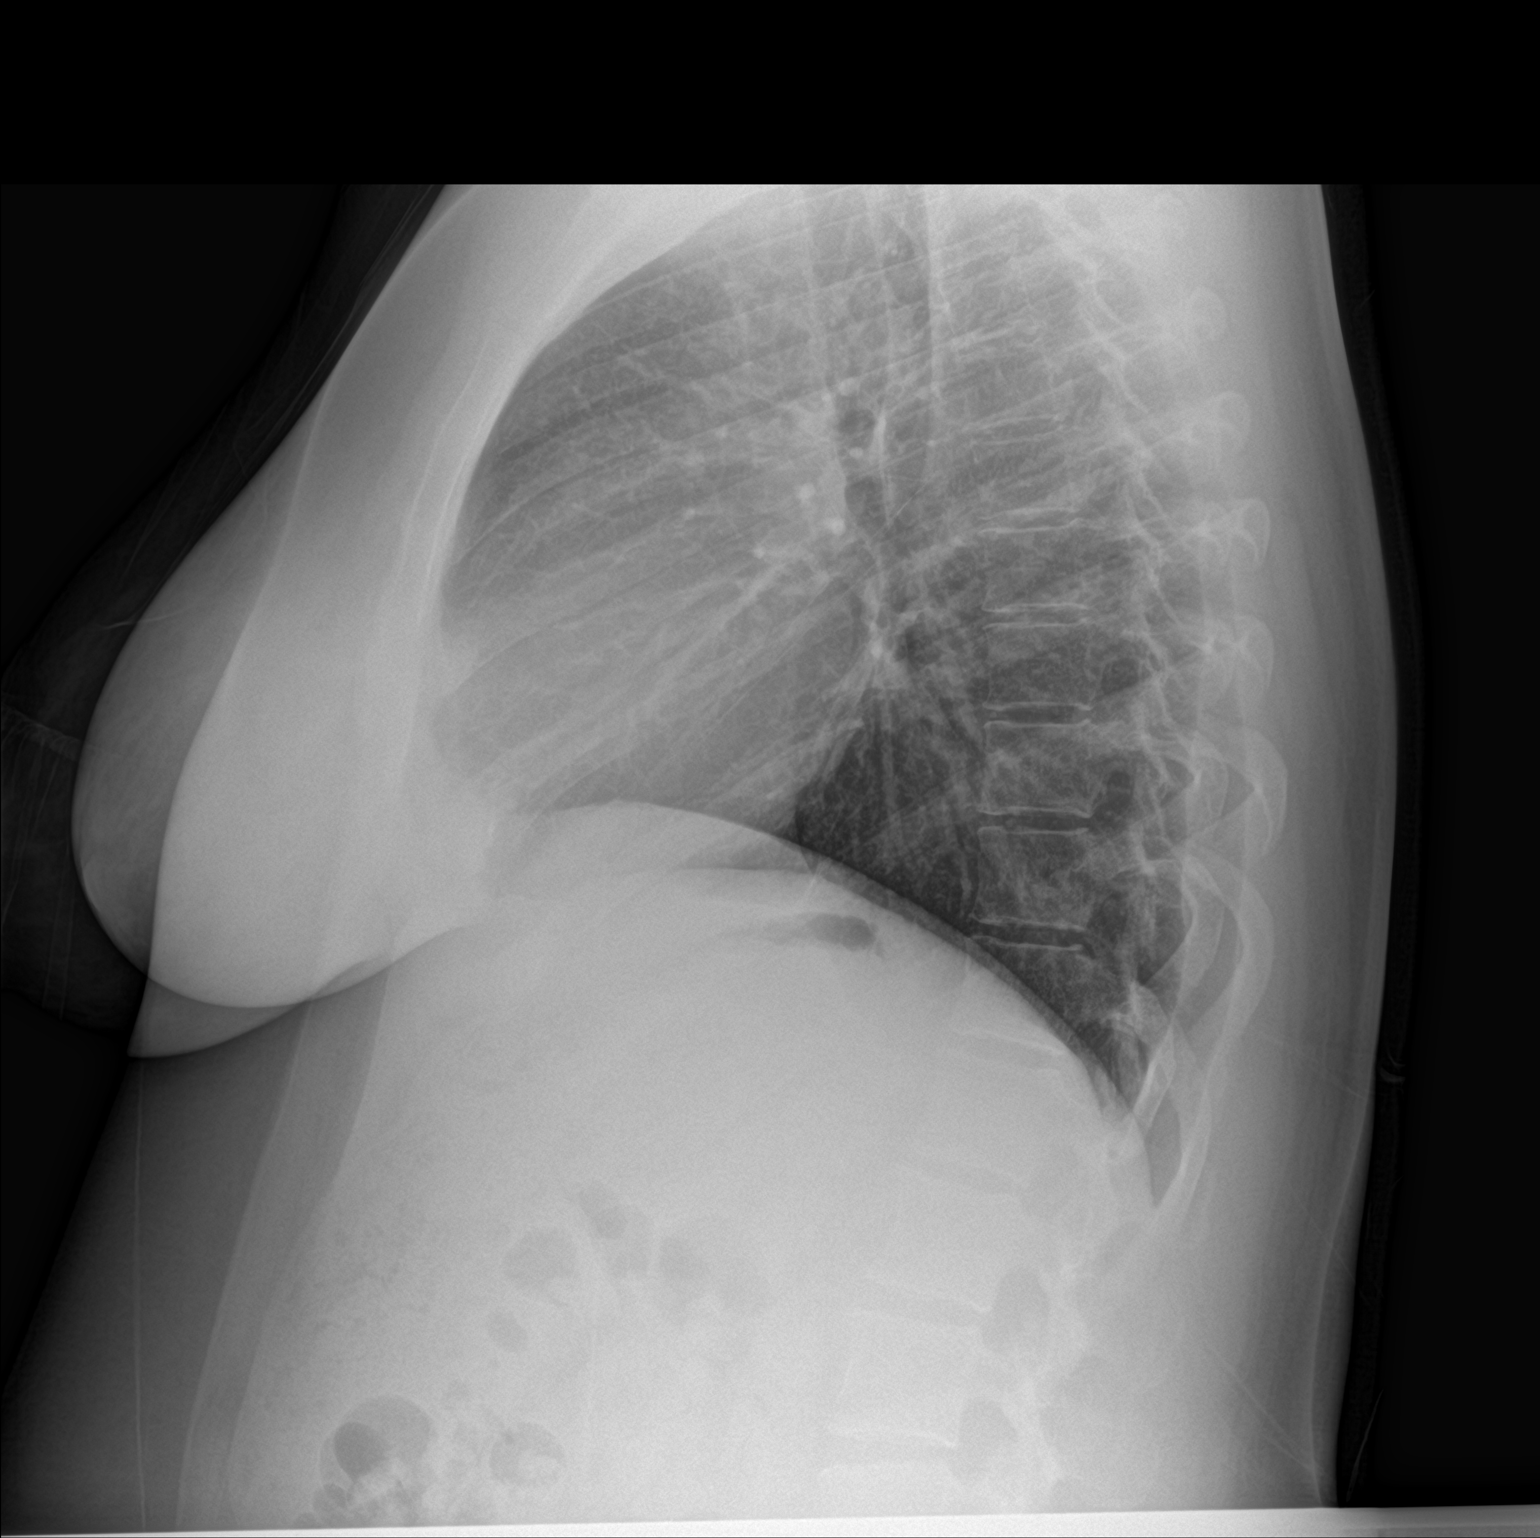

[2 of 2 positions shown; findings below may reference images not displayed]

FINDINGS: The heart size and mediastinal contours are within normal limits.
Both lungs are clear. The visualized skeletal structures are
unremarkable.
IMPRESSION: No active cardiopulmonary disease.

## 2023-05-30 ENCOUNTER — Telehealth: Payer: Self-pay

## 2023-05-30 NOTE — Telephone Encounter (Signed)
See note

## 2023-06-05 ENCOUNTER — Telehealth: Payer: Self-pay

## 2023-06-05 NOTE — Transitions of Care (Post Inpatient/ED Visit) (Cosign Needed)
   06/05/2023  Name: Caitlin Good MRN: [id number] DOB: 1995/08/25  Today's TOC FU Call Status: Today's TOC FU Call Status:: Unsuccessful Call (2nd Attempt) Unsuccessful Call (2nd Attempt) Date: 06/05/23  Attempted to reach the patient regarding the most recent Inpatient/ED visit.  Follow Up Plan: Additional outreach attempts will be made to reach the patient to complete the Transitions of Care (Post Inpatient/ED visit) call.   Signature  Renelda Loma RMA

## 2023-06-06 ENCOUNTER — Telehealth: Payer: Self-pay

## 2023-06-06 NOTE — Telephone Encounter (Signed)
Caitlin Good
